# Patient Record
Sex: Female | Born: 1995 | Race: Black or African American | Hispanic: No | Marital: Single | Smoking: Never smoker
Health system: Southern US, Community
[De-identification: ages and names within clinical notes are randomized; demographics above are authoritative.]

## PROBLEM LIST (undated history)

## (undated) DIAGNOSIS — D649 Anemia, unspecified: Secondary | ICD-10-CM

## (undated) DIAGNOSIS — A549 Gonococcal infection, unspecified: Secondary | ICD-10-CM

## (undated) DIAGNOSIS — F32A Depression, unspecified: Secondary | ICD-10-CM

## (undated) DIAGNOSIS — F329 Major depressive disorder, single episode, unspecified: Secondary | ICD-10-CM

## (undated) DIAGNOSIS — A749 Chlamydial infection, unspecified: Secondary | ICD-10-CM

## (undated) HISTORY — PX: NO PAST SURGERIES: SHX2092

## (undated) HISTORY — DX: Depression, unspecified: F32.A

## (undated) HISTORY — DX: Chlamydial infection, unspecified: A74.9

---
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
## 2013-02-08 ENCOUNTER — Encounter: Payer: Self-pay | Admitting: Family Medicine

## 2013-02-08 ENCOUNTER — Ambulatory Visit (INDEPENDENT_AMBULATORY_CARE_PROVIDER_SITE_OTHER): Payer: Medicaid Other | Admitting: Family Medicine

## 2013-02-08 ENCOUNTER — Other Ambulatory Visit (HOSPITAL_COMMUNITY)
Admission: RE | Admit: 2013-02-08 | Discharge: 2013-02-08 | Disposition: A | Discharge status: Home / Self Care | Payer: Medicaid Other | Source: Ambulatory Visit | Attending: Family Medicine | Admitting: Family Medicine

## 2013-02-08 VITALS — BP 113/72 | HR 87 | Temp 98.2°F | Ht 67.0 in | Wt 136.1 lb

## 2013-02-08 DIAGNOSIS — Z202 Contact with and (suspected) exposure to infections with a predominantly sexual mode of transmission: Secondary | ICD-10-CM | POA: Insufficient documentation

## 2013-02-08 DIAGNOSIS — N898 Other specified noninflammatory disorders of vagina: Secondary | ICD-10-CM

## 2013-02-08 DIAGNOSIS — N760 Acute vaginitis: Secondary | ICD-10-CM

## 2013-02-08 DIAGNOSIS — B9689 Other specified bacterial agents as the cause of diseases classified elsewhere: Secondary | ICD-10-CM | POA: Insufficient documentation

## 2013-02-08 DIAGNOSIS — F32A Depression, unspecified: Secondary | ICD-10-CM

## 2013-02-08 DIAGNOSIS — A5901 Trichomonal vulvovaginitis: Secondary | ICD-10-CM

## 2013-02-08 DIAGNOSIS — F329 Major depressive disorder, single episode, unspecified: Secondary | ICD-10-CM

## 2013-02-08 DIAGNOSIS — Z113 Encounter for screening for infections with a predominantly sexual mode of transmission: Secondary | ICD-10-CM | POA: Insufficient documentation

## 2013-02-08 DIAGNOSIS — Z975 Presence of (intrauterine) contraceptive device: Secondary | ICD-10-CM

## 2013-02-08 DIAGNOSIS — Z0000 Encounter for general adult medical examination without abnormal findings: Secondary | ICD-10-CM

## 2013-02-08 DIAGNOSIS — A499 Bacterial infection, unspecified: Secondary | ICD-10-CM

## 2013-02-08 LAB — POCT WET PREP (WET MOUNT): Clue Cells Wet Prep Whiff POC: POSITIVE

## 2013-02-08 MED ORDER — METRONIDAZOLE 500 MG PO TABS: 500.0000 mg | ORAL_TABLET | Freq: Three times a day (TID) | ORAL | Status: DC

## 2013-02-08 NOTE — Assessment & Plan Note (Signed)
Grossly positive on wet prep, with marked cervical friability and vaginal discharge consistent with diagnosis. Rx for Flagyl 500 mg TID for 7 days. Recommended discussing diagnosis with any partners, though pt states she has not been sexually active in "months." F/u as needed.

## 2013-02-08 NOTE — Assessment & Plan Note (Signed)
Last intercourse was "months ago," though pt positive for Trichomonas this visit. See separate problem list notes. GC / Chlamydia test collected today. Reviewed safe sex practices to prevent STI's. IUD in place for birth control. Will call pt with results of testing today and follow-up as needed.

## 2013-02-08 NOTE — Assessment & Plan Note (Signed)
Main focus of today's visit, though several separate issues identified. Pt not yet 19, so Pap smear not sent (initially collected but not sent for path after review of current guidelines). Pt refuses flu shot. F/u in 1 year for regular well check; f/u sooner for acute depression issue --> see separate problem list note.

## 2013-02-08 NOTE — Assessment & Plan Note (Signed)
Markedly positive wet prep, also with Trichomonas. Rx for Flagyl 500 mg TID for 7 days. F/u as needed.

## 2013-02-08 NOTE — Progress Notes (Signed)
   Subjective:    Patient ID: Sylvia Green, female    DOB: February 25, 1995, 18 y.o.   MRN: [id number]  HPI: Pt presents to clinic for general follow-up / yearly exam. She generally feels well but does report some symptoms of depression. PHQ-9 results / psych ROS below. Otherwise she feels well but requests STI screening.  Possible exposure to STI: - Last activity was "months ago," exboyfriend, not sexually active now - pt reports discharge: foul smell, brownish in color, present on and off for "weeks" - pt does have Mirena in place and states she has never been able to feel strings  Psych concerns: - pt reports feeling down / sad for several weeks, less interested in things than usual - see PHQ-9 flowsheet; score 19, rated "very difficult," with question #9 (suicidality) marked "nearly every day" - pt DENIES current suicidality; last thought of self-harm was "about a week ago" - denies plan / reliable access to weapons - reports she has never been evaluated or treated for depression / mood disorder before  Review of Systems: No fevers, chills, abdominal pain, chest pain, SOB. No N/V/D. No sick contacts.     Objective:   Physical Exam BP 113/72  Pulse 87  Temp(Src) 98.2 F (36.8 C) (Oral)  Ht 5\' 7"  (1.702 m)  Wt 136 lb 1.6 oz (61.735 kg)  BMI 21.31 kg/m2 Gen: well-appearing but slightly withdrawn young adult female in NAD HEENT: [place]/AT, sclerae/conjunctivae clear, no lid lag, EOMI, PERRLA   MMM, posterior oropharynx clear, no cervical lymphadenopathy  neck supple with full ROM, no masses appreciated; thyroid not enlarged  Cardio: RRR, no murmur appreciated; distal pulses intact/symmetric Pulm: CTAB, no wheezes, normal WOB  Abd: soft, nondistended, BS+, no HSM GU: no external vulvar lesions  Speculum exam: IUD strings easily visible at cervical os   Cervix markedly friable with swab for wet prep / GC+Chlamydia   Moderate amount of mucous-y gray discharge  Bimanual exam: no fundal  tenderness or adnexal masses, no CMT tenderness Ext: warm/well-perfused, no cyanosis/clubbing/edema MSK: strength 5/5 in all four extremities, no frank joint deformity/effusion  normal ROM to all four extremities with no point muscle/bony tenderness in spine Neuro/Psych: alert/oriented, sensation grossly intact; normal gait/balance  mood mildly depressed with congruent blunted affect  DENIES SI / HI or AH / VH, thought content normal, process linear / goal-directed  Not acutely psychotic, hallucinating, or delusional    Assessment & Plan:

## 2013-02-08 NOTE — Patient Instructions (Addendum)
Thank you for coming in, today!  You have two infections, trichomonas and bacterial vaginosis. Trichomonas is a sexually-transmitted infection. BV is an overgrowth of normal vaginal bacteria. They are both treated with a medicine called metronidazole. You should take the medicine three times a day for 7 days. DO NOT drink alcohol while taking this medicine. I will call you with the results of the other tests we did.  For your mood: I'm worried that you may need to be treated for depression. Come back to see me in about 2 weeks specifically to talk about this. In the meantime, if you feel like you want to harm yourself, call 911 or [facility] immediately [facility]. The EMT's can help you and [facility] has psychiatrists there that can evaluate you right away.  Please feel free to call with any questions or concerns at any time, at [phone]. --Dr. Casper HarrisonStreet

## 2013-02-08 NOTE — Assessment & Plan Note (Signed)
Major concern of today's findings. PHQ-9 markedly positive, with question 9 positive, though pt repeatedly denies current suicidal ideation, reliable access to weapons, or specific plan or intent for self-harm. She denies history of previous evaluation or diagnosis / treatment of depression, and is interested in further counseling / work-up. Discussed at length the importance of following up for a visit specifically to address this. Reviewed instructions to call 911 or to [facility] if pt feels acutely suicidal for immediate evaluation / assistance. Will follow up in 1-2 weeks and will discuss with Dr. Pascal LuxKane.

## 2013-02-13 ENCOUNTER — Encounter: Payer: Self-pay | Admitting: Family Medicine

## 2013-02-22 ENCOUNTER — Encounter: Payer: Self-pay | Admitting: Family Medicine

## 2013-02-22 ENCOUNTER — Ambulatory Visit (INDEPENDENT_AMBULATORY_CARE_PROVIDER_SITE_OTHER): Payer: Medicaid Other | Admitting: Family Medicine

## 2013-02-22 VITALS — BP 120/74 | HR 104 | Temp 101.4°F | Ht 67.0 in | Wt 137.6 lb

## 2013-02-22 DIAGNOSIS — J111 Influenza due to unidentified influenza virus with other respiratory manifestations: Secondary | ICD-10-CM | POA: Insufficient documentation

## 2013-02-22 DIAGNOSIS — F32A Depression, unspecified: Secondary | ICD-10-CM

## 2013-02-22 DIAGNOSIS — F329 Major depressive disorder, single episode, unspecified: Secondary | ICD-10-CM

## 2013-02-22 MED ORDER — OSELTAMIVIR PHOSPHATE 75 MG PO CAPS: 75.0000 mg | ORAL_CAPSULE | Freq: Two times a day (BID) | ORAL | Status: DC

## 2013-02-22 MED ORDER — FLUOXETINE HCL 20 MG PO TABS: 20.0000 mg | ORAL_TABLET | Freq: Every day | ORAL | Status: DC

## 2013-02-22 MED ORDER — ONDANSETRON HCL 4 MG PO TABS: 4.0000 mg | ORAL_TABLET | Freq: Three times a day (TID) | ORAL | Status: DC | PRN

## 2013-02-22 NOTE — Assessment & Plan Note (Signed)
Concerning for primary major depression, though difficult to ascertain how long current symptoms have been present. PHQ-9 completed last visit, and pt continues to voice suicidality in the PAST but NONE currently, without reliable access to weapons and no specific plan or intent for self-harm. Rx to start Prozac 20 mg; pt presented with likely flu, so may delay starting for several days until feeling better from that illness. Provided pt with contact information for Dr. Pascal LuxKane and explained to pt that medication in conjunction with therapy is more likely to help that either medication or therapy alone. Reviewed red flags for worsening symptoms and expected side effects of medications. Reviewed instructions for when to call emergency line for help or when to call 911 / [facility]. Follow-up in about 2-4 weeks to re-eval once starting medication.

## 2013-02-22 NOTE — Progress Notes (Signed)
   Subjective:    Patient ID: Sylvia Green, female    DOB: Jul 18, 1995, 18 y.o.   MRN: [id number]  HPI: Pt presents to clinic for f/u of depression (see my last progress note, PHQ-9 markedly positive, with question 9 scored with a 3). Of note, pt also complains of fever / chills, body aches for 1 day, with temp of 101+.  #Fever / body aches: - fever, body aches, sneezing, headache, since this morning - drinking water but appetite is pretty poor - denies N/V/D, SOB, chest pain - has taken no medications, denies any alleviating or exacerbating factors - did NOT get flu shot this year, no definite sick contacts  # Depression: Presenting Issue: markedly positive depression screen on 1/22, with intermittent suicidal ideation. - pt reports incident of drawing "stars" on a piece of paper last week  - she states she "wasn't paying attention and spaced out" and noticed the stars spelled out "DIE"  - pt has never had this happen before - pt reports she first felt like harming herself when she was 14 - last time she felt this way was about 1 month ago  Report of symptoms: Sleep - decreased Interest - decreased  Guilt/worthlessness - present Energy - decreased or fatigued, present  Concentration/difficulty making decisions - present Appetite - normal Psychomotor activity - not present Suicidal ideation - not CURRENTLY present  Duration of symptoms: "years," "since I was a teenager," first around age 18 Impact on function: moderate - pt does not engage in hobbies like she used to and takes longer to do daily activities  Psychiatric History (include age of onset of first mood disturbance): - Diagnoses: never prior - Treatment: non prior  Family history of psychiatric issues: none Current and history of substance use: none  Medical conditions that might explain or contribute to symptoms: none  PHQ-9: Questions 1, 3, 4, 5, 7 scored 2; questions 2 and 9 scored 3; overall "very  difficult")  -these answers provided at previous visit  -pt repeatedly stated at that time she did NOT feel actively suicidal  MDQ (if indicated): negative  Review of Systems: As above. Again strongly denies feelings of active suicidality, today.     Objective:   Physical Exam BP 120/74  Pulse 104  Temp(Src) 101.4 F (38.6 C) (Oral)  Ht 5\' 7"  (1.702 m)  Wt 137 lb 9.6 oz (62.415 kg)  BMI 21.55 kg/m2  Breastfeeding? Yes Gen: uncomfortable but non-toxic-appearing adult female in NAD Psych: mood depressed with congruent, flattened affect  Generally very poor eye-contact but thought content normal with linear / goal-directed process  NOT actively suicidal, without AH / VH or active psychosis or delusion  Judgment / insight seem fair Cardio: slight tachycardia, but no murmur appreciated, limbs well-perfused Pulm: CTAB, no wheezes, normal WOB HEENT: mildly red posterior oropharynx, TM's clear, MM slightly dry  PERRLA, EOMI, nasal mucosae red / inflamed / mildly edematous  Shotty cervical lymphadenopathy but nontender Skin: warm to touch, not diaphoretic / dry, no rashes     Assessment & Plan:  See problem list notes.

## 2013-02-22 NOTE — Patient Instructions (Signed)
Thank you for coming in, today!  I think you may have the flu. I want you to take Tamiflu twice a day for 5 days. Your symptoms should be better in 7-10 days. If they're not better or if they get worse, come back see me.  I do think htat you are depressed. I think that medicine may help. I want you to start taking Prozac (fluoxetine) 20 mg once a day, every day. It may take 2-4 weeks or longer for it to start working. If you take it and your feelings get worse, or if you feel like you want to harm yourself, call 911 or [facility].  I want you to come back to see me in about 2 weeks. Please feel free to call with any questions or concerns at any time, at [phone]. --Dr. Casper HarrisonStreet

## 2013-02-22 NOTE — Assessment & Plan Note (Signed)
Symptoms present for 24 hours or less, overall consistent with influenza (fever, body aches, coryza, etc, in non-immunized otherwise healthy patient). Rx for Tamiflu and push fluids. Red flags reviewed. F/u as needed.

## 2013-03-08 ENCOUNTER — Ambulatory Visit: Payer: Medicaid Other | Admitting: Family Medicine

## 2013-04-04 ENCOUNTER — Encounter: Payer: Self-pay | Admitting: Family Medicine

## 2013-04-04 ENCOUNTER — Ambulatory Visit (INDEPENDENT_AMBULATORY_CARE_PROVIDER_SITE_OTHER): Payer: Medicaid Other | Admitting: Family Medicine

## 2013-04-04 VITALS — BP 115/77 | HR 90 | Temp 97.7°F | Ht 68.0 in | Wt 133.8 lb

## 2013-04-04 DIAGNOSIS — F32A Depression, unspecified: Secondary | ICD-10-CM

## 2013-04-04 DIAGNOSIS — F329 Major depressive disorder, single episode, unspecified: Secondary | ICD-10-CM

## 2013-04-04 NOTE — Assessment & Plan Note (Signed)
A: Improved with Prozac, but still with some symptoms, most notably poor interest and feeling "down." Denies frank side effects of medication. No SI / HI. Potentially interested in therapy but has not yet contacted Dr. Pascal LuxKane or any other counselors / therapists.  P: Continue Prozac 20 mg for now. Provided pt with Dr. Carola RhineKane's number again and encouraged her to talk to Dr. Pascal LuxKane about possible therapy and explained that calling does not obligate her (the pt) to any therapy if she does not decide to do any. F/u with me in 3 weeks, then likely every 2-3 months or sooner if needed.

## 2013-04-04 NOTE — Progress Notes (Signed)
   Subjective:    Patient ID: Sylvia Green, female    DOB: 19-Dec-1995, 18 y.o.   MRN: [id number]  HPI: Pt presents to clinic for f/u on depression. She started Prozac 20 mg daily about 3 weeks ago, which has been helping. Overall, she feels less "down," most days but still does have days with little interest in activities. Her sleep has been "some" improved. Overall, she is slightly vague about her symptoms. She denies SI / HI. She has had no GI side effects with the Prozac. Of note, pt had the flu around the time of starting Prozac. She is feeling much better and completed a course of Tamiflu. She has had no further coryza-type symptoms in the past several weeks.  Review of Systems: As above. Denies fever / chills, SOB, abdominal pain, N / V.     Objective:   Physical Exam BP 115/77  Pulse 90  Temp(Src) 97.7 F (36.5 C) (Oral)  Ht 5\' 8"  (1.727 m)  Wt 133 lb 12.8 oz (60.691 kg)  BMI 20.35 kg/m2 Gen: well-appearing young adult female in NAD Pulm: CTAB, no wheezes Cardio: RRR, no murmur Abd: soft, nontender, BS+  Mental Status Examination/Evaluation: Objective:  Appearance: Neat and Well Groomed  Psychomotor Activity:  Normal  Eye Contact::  Fair  Speech:  Clear and Coherent and Normal Rate  Volume:  Normal  Mood:  Relatively euthymic  Affect:  Appropriate and Congruent  Thought Process:  Coherent, Goal Directed and Linear  Orientation:  Full (Time, Place, and Person)  Thought Content:  Normal  Suicidal Thoughts:  No  Homicidal Thoughts:  No  Judgement:  Fair  Insight:  Fair      Assessment & Plan:

## 2013-04-04 NOTE — Patient Instructions (Signed)
Thank you for coming in, today!  Keep taking your Prozac 20 mg daily. I would expect it to keep doing better for the next 3 weeks. It takes about that long to get the maximum effect (six weeks from starting).  Come back to see me in about 3 weeks. At that time, we'll see if we need to adjust or increase anything. If your symptoms get worse or new symptoms come up in the meantime, you can always come back sooner.  I will give you Dr. Carola RhineKane's card. Call her to talk about maybe getting set up for counseling. You can call her to talk about it without being obligated to starting anything. She can give you good resources on things that may help the medicine work even better.  Please feel free to call with any questions or concerns at any time, at [phone]. --Dr. Casper HarrisonStreet

## 2013-04-27 ENCOUNTER — Ambulatory Visit: Payer: Medicaid Other | Admitting: Family Medicine

## 2013-05-31 ENCOUNTER — Ambulatory Visit: Payer: Medicaid Other | Admitting: Family Medicine

## 2013-08-18 ENCOUNTER — Encounter (HOSPITAL_COMMUNITY): Payer: Self-pay | Admitting: Emergency Medicine

## 2013-08-18 ENCOUNTER — Emergency Department (HOSPITAL_COMMUNITY)
Admission: EM | Admit: 2013-08-18 | Discharge: 2013-08-18 | Disposition: A | Discharge status: Home / Self Care | Payer: Medicaid Other | Attending: Emergency Medicine | Admitting: Emergency Medicine

## 2013-08-18 DIAGNOSIS — Z79899 Other long term (current) drug therapy: Secondary | ICD-10-CM | POA: Insufficient documentation

## 2013-08-18 DIAGNOSIS — Z3202 Encounter for pregnancy test, result negative: Secondary | ICD-10-CM | POA: Insufficient documentation

## 2013-08-18 DIAGNOSIS — Z8619 Personal history of other infectious and parasitic diseases: Secondary | ICD-10-CM | POA: Insufficient documentation

## 2013-08-18 DIAGNOSIS — R55 Syncope and collapse: Secondary | ICD-10-CM | POA: Insufficient documentation

## 2013-08-18 DIAGNOSIS — R404 Transient alteration of awareness: Secondary | ICD-10-CM | POA: Insufficient documentation

## 2013-08-18 DIAGNOSIS — R42 Dizziness and giddiness: Secondary | ICD-10-CM | POA: Insufficient documentation

## 2013-08-18 DIAGNOSIS — I951 Orthostatic hypotension: Secondary | ICD-10-CM

## 2013-08-18 LAB — BASIC METABOLIC PANEL
ANION GAP: 13 (ref 5–15)
BUN: 8 mg/dL (ref 6–23)
CHLORIDE: 104 meq/L (ref 96–112)
CO2: 22 meq/L (ref 19–32)
CREATININE: 0.76 mg/dL (ref 0.50–1.10)
Calcium: 9.3 mg/dL (ref 8.4–10.5)
GFR calc Af Amer: >90 mL/min (ref >90)
GFR calc non Af Amer: >90 mL/min (ref >90)
GLUCOSE: 84 mg/dL (ref 70–99)
Potassium: 3.8 mEq/L (ref 3.7–5.3)
Sodium: 139 mEq/L (ref 137–147)

## 2013-08-18 LAB — CBC WITH DIFFERENTIAL/PLATELET
BASOS ABS: 0 10*3/uL (ref 0.0–0.1)
BASOS PCT: 1 % (ref 0–1)
EOS ABS: 0.1 10*3/uL (ref 0.0–0.7)
Eosinophils Relative: 1 % (ref 0–5)
HCT: 34.6 % — ABNORMAL LOW (ref 36.0–46.0)
HEMOGLOBIN: 11.8 g/dL — AB (ref 12.0–15.0)
Lymphocytes Relative: 32 % (ref 12–46)
Lymphs Abs: 2.4 10*3/uL (ref 0.7–4.0)
MCH: 31.2 pg (ref 26.0–34.0)
MCHC: 34.1 g/dL (ref 30.0–36.0)
MCV: 91.5 fL (ref 78.0–100.0)
MONO ABS: 0.6 10*3/uL (ref 0.1–1.0)
MONOS PCT: 7 % (ref 3–12)
NEUTROS ABS: 4.5 10*3/uL (ref 1.7–7.7)
NEUTROS PCT: 59 % (ref 43–77)
Platelets: 166 10*3/uL (ref 150–400)
RBC: 3.78 MIL/uL — AB (ref 3.87–5.11)
RDW: 13.2 % (ref 11.5–15.5)
WBC: 7.6 10*3/uL (ref 4.0–10.5)

## 2013-08-18 LAB — URINALYSIS, ROUTINE W REFLEX MICROSCOPIC
BILIRUBIN URINE: NEGATIVE
GLUCOSE, UA: NEGATIVE mg/dL
Ketones, ur: NEGATIVE mg/dL
Leukocytes, UA: NEGATIVE
NITRITE: NEGATIVE
PH: 8 (ref 5.0–8.0)
Protein, ur: NEGATIVE mg/dL
SPECIFIC GRAVITY, URINE: 1.014 (ref 1.005–1.030)
Urobilinogen, UA: 0.2 mg/dL (ref 0.0–1.0)

## 2013-08-18 LAB — POC URINE PREG, ED: Preg Test, Ur: NEGATIVE

## 2013-08-18 LAB — URINE MICROSCOPIC-ADD ON

## 2013-08-18 MED ORDER — SODIUM CHLORIDE 0.9 % IV BOLUS (SEPSIS)
1000.0000 mL | Freq: Once | INTRAVENOUS | Status: AC
  Administered 2013-08-18: 1000 mL via INTRAVENOUS

## 2013-08-18 NOTE — ED Notes (Signed)
CBC requires recollect, per lab, blood was hemolyzed.  Phlebotomy notified

## 2013-08-18 NOTE — Discharge Instructions (Signed)
Syncope °Syncope is a medical term for fainting or passing out. This means you lose consciousness and drop to the ground. People are generally unconscious for less than 5 minutes. You may have some muscle twitches for up to 15 seconds before waking up and returning to normal. Syncope occurs more often in older adults, but it can happen to anyone. While most causes of syncope are not dangerous, syncope can be a sign of a serious medical problem. It is important to seek medical care.  °CAUSES  °Syncope is caused by a sudden drop in blood flow to the brain. The specific cause is often not determined. Factors that can bring on syncope include: °· Taking medicines that lower blood pressure. °· Sudden changes in posture, such as standing up quickly. °· Taking more medicine than prescribed. °· Standing in one place for too long. °· Seizure disorders. °· Dehydration and excessive exposure to heat. °· Low blood sugar (hypoglycemia). °· Straining to have a bowel movement. °· Heart disease, irregular heartbeat, or other circulatory problems. °· Fear, emotional distress, seeing blood, or severe pain. °SYMPTOMS  °Right before fainting, you may: °· Feel dizzy or light-headed. °· Feel nauseous. °· See all white or all black in your field of vision. °· Have cold, clammy skin. °DIAGNOSIS  °Your health care provider will ask about your symptoms, perform a physical exam, and perform an electrocardiogram (ECG) to record the electrical activity of your heart. Your health care provider may also perform other heart or blood tests to determine the cause of your syncope which may include: °· Transthoracic echocardiogram (TTE). During echocardiography, sound waves are used to evaluate how blood flows through your heart. °· Transesophageal echocardiogram (TEE). °· Cardiac monitoring. This allows your health care provider to monitor your heart rate and rhythm in real time. °· Holter monitor. This is a portable device that records your  heartbeat and can help diagnose heart arrhythmias. It allows your health care provider to track your heart activity for several days, if needed. °· Stress tests by exercise or by giving medicine that makes the heart beat faster. °TREATMENT  °In most cases, no treatment is needed. Depending on the cause of your syncope, your health care provider may recommend changing or stopping some of your medicines. °HOME CARE INSTRUCTIONS °· Have someone stay with you until you feel stable. °· Do not drive, use machinery, or play sports until your health care provider says it is okay. °· Keep all follow-up appointments as directed by your health care provider. °· Lie down right away if you start feeling like you might faint. Breathe deeply and steadily. Wait until all the symptoms have passed. °· Drink enough fluids to keep your urine clear or pale yellow. °· If you are taking blood pressure or heart medicine, get up slowly and take several minutes to sit and then stand. This can reduce dizziness. °SEEK IMMEDIATE MEDICAL CARE IF:  °· You have a severe headache. °· You have unusual pain in the chest, abdomen, or back. °· You are bleeding from your mouth or rectum, or you have black or tarry stool. °· You have an irregular or very fast heartbeat. °· You have pain with breathing. °· You have repeated fainting or seizure-like jerking during an episode. °· You faint when sitting or lying down. °· You have confusion. °· You have trouble walking. °· You have severe weakness. °· You have vision problems. °If you fainted, call your local emergency services (911 in [place] Do not drive   yourself to the hospital.  °MAKE SURE YOU: °· Understand these instructions. °· Will watch your condition. °· Will get help right away if you are not doing well or get worse. °Document Released: 01/04/2005 Document Revised: 01/09/2013 Document Reviewed: 03/05/2011 °[employer]® Patient Information ©2015 [employer]. This information is not intended to replace  advice given to you by your health care provider. Make sure you discuss any questions you have with your health care provider. ° °

## 2013-08-18 NOTE — ED Notes (Signed)
Bed: WA08 Expected date:  Expected time:  Means of arrival:  Comments: EMS syncope/ok now

## 2013-08-18 NOTE — ED Notes (Addendum)
Repeat CBC collected and sent to lab at this time.

## 2013-08-18 NOTE — ED Provider Notes (Signed)
CSN: [id number]     Arrival date & time 08/18/13  1733 History   First MD Initiated Contact with Patient 08/18/13 1744     Chief Complaint  Patient presents with  . Loss of Consciousness     (Consider location/radiation/quality/duration/timing/severity/associated sxs/prior Treatment) Patient is a 18 y.o. female presenting with syncope.  Loss of Consciousness Episode history:  Single Most recent episode:  Today Duration:  1 minute Timing:  Constant Progression:  Resolved Chronicity:  Recurrent (similar episode one year ago) Context: standing up   Witnessed: yes   Relieved by:  Lying down Associated symptoms: dizziness   Associated symptoms: no chest pain and no nausea     Past Medical History  Diagnosis Date  . Gonorrhea    Past Surgical History  Procedure Laterality Date  . No past surgeries     Family History  Problem Relation Age of Onset  . Hypertension Mother   . Depression Mother   . Anxiety disorder Mother   . Hyperlipidemia Mother    History  Substance Use Topics  . Smoking status: Never Smoker   . Smokeless tobacco: Never Used  . Alcohol Use: No   OB History   Grav Para Term Preterm Abortions TAB SAB Ect Mult Living   2 2 2       2      Review of Systems  Cardiovascular: Positive for syncope. Negative for chest pain.  Gastrointestinal: Negative for nausea.  Neurological: Positive for dizziness.  All other systems reviewed and are negative.     Allergies  Review of patient's allergies indicates no known allergies.  Home Medications   Prior to Admission medications   Medication Sig Start Date End Date Taking? Authorizing Provider  clotrimazole-betamethasone (LOTRISONE) cream Apply to affected area 2 times daily 10/15/12   Graylon Good, PA-C  FLUoxetine (PROZAC) 20 MG tablet Take 1 tablet (20 mg total) by mouth daily. 02/22/13   Stephanie Coup Street, MD  levonorgestrel (MIRENA) 20 MCG/24HR IUD 1 each by Intrauterine route once. Pt had it placed  on 10/07/11    Historical Provider, MD  ondansetron (ZOFRAN) 4 MG tablet Take 1 tablet (4 mg total) by mouth every 8 (eight) hours as needed for nausea or vomiting. 02/22/13   Stephanie Coup Street, MD   BP 106/58  Pulse 76  Temp(Src) 99 F (37.2 C) (Oral)  Resp 18  SpO2 100% Physical Exam  Nursing note and vitals reviewed. Constitutional: She is oriented to person, place, and time. She appears well-developed and well-nourished. No distress.  HENT:  Head: Normocephalic and atraumatic.  Mouth/Throat: Oropharynx is clear and moist.  Eyes: Conjunctivae are normal. Pupils are equal, round, and reactive to light. No scleral icterus.  Neck: Neck supple.  Cardiovascular: Normal rate, regular rhythm, normal heart sounds and intact distal pulses.   No murmur heard. Pulmonary/Chest: Effort normal and breath sounds normal. No stridor. No respiratory distress. She has no rales.  Abdominal: Soft. Bowel sounds are normal. She exhibits no distension. There is no tenderness.  Musculoskeletal: Normal range of motion.  Neurological: She is alert and oriented to person, place, and time.  Skin: Skin is warm and dry. No rash noted.  Psychiatric: She has a normal mood and affect. Her behavior is normal.    ED Course  Procedures (including critical care time) Labs Review Labs Reviewed  URINALYSIS, ROUTINE W REFLEX MICROSCOPIC - Abnormal; Notable for the following:    Hgb urine dipstick SMALL (*)    All other  components within normal limits  CBC WITH DIFFERENTIAL - Abnormal; Notable for the following:    RBC 3.78 (*)    Hemoglobin 11.8 (*)    HCT 34.6 (*)    All other components within normal limits  BASIC METABOLIC PANEL  URINE MICROSCOPIC-ADD ON  CBC WITH DIFFERENTIAL  POC URINE PREG, ED    Imaging Review No results found.   EKG Interpretation   Date/Time:  Saturday August 18 2013 17:44:06 EDT Ventricular Rate:  73 PR Interval:  144 QRS Duration: 88 QT Interval:  391 QTC Calculation:  431 R Axis:   68 Text Interpretation:  Sinus rhythm No significant change was found  Confirmed by [facility]  MD, TREY ([id number]) on 08/18/2013 6:04:59 PM      MDM   Final diagnoses:  Orthostatic syncope    18 yo female presenting with syncope.  Likely orthostatic after standing up.  Orthostatics positive.  Well appearing now.  Similar episode a year ago.  She reports no fam hx of sudden cardiac death.  EKG negative, no delta waves, brugade, or prolonged QT.  Plan to check basic labs, give IV fluids.  Anticipate dc.    Labs negative.  Pt feels better.  Appears stable for dc home with pcp follow up.    Candyce ChurnJohn David Lacorey Brusca III, MD 08/19/13 0030

## 2013-08-18 NOTE — ED Notes (Signed)
Mom found patient lying in floor today.  Family reports patient unconscious for maybe a minute.  CBG-92.  Patient alert and oriented ambulatory at scene.  No bruising, EMS reported patient did not hit head.  No c/o pain.  VSS.

## 2013-08-27 ENCOUNTER — Ambulatory Visit: Payer: Medicaid Other | Admitting: Family Medicine

## 2013-09-07 ENCOUNTER — Ambulatory Visit: Payer: Medicaid Other | Admitting: Family Medicine

## 2013-10-03 ENCOUNTER — Ambulatory Visit (INDEPENDENT_AMBULATORY_CARE_PROVIDER_SITE_OTHER): Payer: Medicaid Other | Admitting: Family Medicine

## 2013-10-03 ENCOUNTER — Encounter: Payer: Self-pay | Admitting: Family Medicine

## 2013-10-03 VITALS — BP 105/69 | HR 93 | Temp 98.4°F | Ht 68.0 in | Wt 130.6 lb

## 2013-10-03 DIAGNOSIS — F329 Major depressive disorder, single episode, unspecified: Secondary | ICD-10-CM

## 2013-10-03 DIAGNOSIS — D649 Anemia, unspecified: Secondary | ICD-10-CM

## 2013-10-03 DIAGNOSIS — F32A Depression, unspecified: Secondary | ICD-10-CM

## 2013-10-03 LAB — CBC
HEMATOCRIT: 35.1 % — AB (ref 36.0–46.0)
HEMOGLOBIN: 12.5 g/dL (ref 12.0–15.0)
MCH: 31.4 pg (ref 26.0–34.0)
MCHC: 35.6 g/dL (ref 30.0–36.0)
MCV: 88.2 fL (ref 78.0–100.0)
Platelets: 214 10*3/uL (ref 150–400)
RBC: 3.98 MIL/uL (ref 3.87–5.11)
RDW: 13.7 % (ref 11.5–15.5)
WBC: 6.4 10*3/uL (ref 4.0–10.5)

## 2013-10-03 MED ORDER — FLUOXETINE HCL 20 MG PO TABS: 20.0000 mg | ORAL_TABLET | Freq: Every day | ORAL | Status: DC

## 2013-10-03 MED ORDER — FERROUS SULFATE 325 (65 FE) MG PO TABS: 325.0000 mg | ORAL_TABLET | Freq: Every day | ORAL | Status: DC

## 2013-10-03 NOTE — Progress Notes (Signed)
   Subjective:    Patient ID: Sylvia Green, female    DOB: 1995-11-23, 18 y.o.   MRN: [id number]  HPI: Pt presents to clinic with mother with several complaints, including depression and thoughts of self-harm (cutting her arm); she has thought about this but has not done anything. She does not have specific hobbies and is slightly vague about whether or not she has true anhedonia. She does endorse insomnia, and feels "run down" and tired often. She also complains of "feeling cold all the time," and had episode of syncope about a month ago that lasted a few minutes. She was seen in the ED at that time and felt better with IV fluids; she states she feels like she drinks a good amount of fluids.  Review of Systems: As above. Denies fever / chills, N/V. Does have some occasional abdominal cramping; she has a Mirena in place and has regular periods. She denies vaginal burning, itching, or discharge.     Objective:   Physical Exam BP 105/69  Pulse 93  Temp(Src) 98.4 F (36.9 C) (Oral)  Ht  (1.727 m)  Wt 130 lb 9.6 oz (59.24 kg)  BMI 19.86 kg/m2 Gen: well-appearing but slightly withdrawn young adult female in NAD HEENT: [place]/AT, EOMI, TM's clear bilaterally  Conjunctivae and gingivae slightly pale  Oropharynx clear Cardio: RRR, no murmur appreciated Pulm: CTAB, no wheezes Skin: warm, dry, intact Ext: warm, well-perfused  Mental Status Examination/Evaluation: Objective:  Appearance: Casual and Well Groomed  Psychomotor Activity:  Decreased  Eye Contact::  Fair  Speech:  soft  Volume:  Decreased  Mood:  Reported depressed  Affect:  Blunt  Thought Process:  Coherent, Goal Directed and Logical  Orientation:  Full (Time, Place, and Person)  Thought Content:  normal without delusions, AH/VH or SI/HI  Suicidal Thoughts:  Yes.  without intent/plan; ONLY INTERMITTENT, NOT ACTIVE  Homicidal Thoughts:  No  Judgement:  Fair  Insight:  Fair and Shallow      Assessment & Plan:  See  problem list notes.

## 2013-10-03 NOTE — Patient Instructions (Signed)
Thank you for coming in, today!  For your depression: I want to restart you on Prozac, since that helped before. This should help with your mood and your sleep. If it does not, we can try going up on the dose. It takes about 4-6 weeks to tell much difference.  For your feeling cold all the time and passing out: You were slightly anemic about a month ago. I want to recheck that today. I will call you or send you a letter with the result. In the meantime, start taking an iron supplement. This is one pill once a day with food. We will recheck your blood in about a month to see if this is helping.  Come back to see me in about 1 month. If you have any issues in the meantime or if you feel like you want to harm yourself, call 911 immediately, or come back to see me. Please feel free to call with any questions or concerns at any time, at [phone]. --Dr. Casper Harrison

## 2013-10-03 NOTE — Assessment & Plan Note (Signed)
Noted with Hb 11.8 about 2 months ago, during syncopal episode felt to be related to hypovolemia at that time. Anemia possibly explains some feelings of being "cold" all the time and may explain lack of energy. May also be related to syncope, itself. No report of severe / heavy periods or frank blood loss. Will recheck CBC today and start oral iron, then plan to follow up in 1 month and repeat CBC at that time. F/u as needed, otherwise.

## 2013-10-03 NOTE — Assessment & Plan Note (Signed)
A: Intermittent symptoms, present again similar to previous presentation earlier this year. Was previously on Prozac 20 mg daily with some benefit, but pt did not follow up as instructed and thus ran out of her medication. Pt did not contact Dr. Pascal Lux as instructed, either. Symptoms today are somewhat vaguely described, though she denies frank / active suicidality and does not appear to be a danger to herself or others.  P: Restart Prozac 20 mg and f/u in about 1 month. Will continue to encourage pt to call Dr. Pascal Lux to discuss therapy. Reiterated calling for help (clinic, emergency line, or 911) if she feels like she wants to harm herself.

## 2013-10-04 ENCOUNTER — Encounter: Payer: Self-pay | Admitting: Family Medicine

## 2013-11-01 ENCOUNTER — Ambulatory Visit: Payer: Medicaid Other | Admitting: Family Medicine

## 2013-11-19 ENCOUNTER — Encounter: Payer: Self-pay | Admitting: Family Medicine

## 2013-12-06 ENCOUNTER — Emergency Department (HOSPITAL_COMMUNITY)
Admission: EM | Admit: 2013-12-06 | Discharge: 2013-12-07 | Disposition: A | Discharge status: Home / Self Care | Payer: Medicaid Other | Attending: Emergency Medicine | Admitting: Emergency Medicine

## 2013-12-06 ENCOUNTER — Encounter (HOSPITAL_COMMUNITY): Payer: Self-pay | Admitting: Emergency Medicine

## 2013-12-06 DIAGNOSIS — Z8619 Personal history of other infectious and parasitic diseases: Secondary | ICD-10-CM | POA: Insufficient documentation

## 2013-12-06 DIAGNOSIS — M549 Dorsalgia, unspecified: Secondary | ICD-10-CM | POA: Diagnosis present

## 2013-12-06 DIAGNOSIS — Z79899 Other long term (current) drug therapy: Secondary | ICD-10-CM | POA: Insufficient documentation

## 2013-12-06 DIAGNOSIS — N390 Urinary tract infection, site not specified: Secondary | ICD-10-CM | POA: Diagnosis not present

## 2013-12-06 NOTE — ED Provider Notes (Signed)
CSN: [id number]     Arrival date & time 12/06/13  2204 History   None   This chart was scribed for NP, Earley FavorGail Iyari Hagner, working with [name], MD by Marica OtterNusrat Rahman, ED Scribe. This patient was seen in room WTR9/WTR9 and the patient's care was started at 11:39 PM.  Chief Complaint  Patient presents with  . Back Pain  . Right Side Pain    The history is provided by the patient. No language interpreter was used.   PCP: Street, Cristal Deerhristopher, MD HPI Comments: Sylvia Green is a 18 y.o. female, with medical Hx noted below, who presents to the Emergency Department complaining of atraumatic, acute, intermittent, non-radiating right sided back pain with associated right sided pain onset 2 days ago. Pt describes the pain as a "sharp" pain and rates her present pain a 4 out of 10. 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 Pt reports he last menstraul cycle was in October. Pt denies dysuria, urinary frequency, or any other urinary Sx. Pt further denies n/v/, vaginal discharge,  or any other Sx.   Past Medical History  Diagnosis Date  . Gonorrhea    Past Surgical History  Procedure Laterality Date  . No past surgeries     Family History  Problem Relation Age of Onset  . Hypertension Mother   . Depression Mother   . Anxiety disorder Mother   . Hyperlipidemia Mother    History  Substance Use Topics  . Smoking status: Never Smoker   . Smokeless tobacco: Never Used  . Alcohol Use: No   OB History    Gravida Para Term Preterm AB TAB SAB Ectopic Multiple Living   2 2 2       2      Review of Systems  Constitutional: Negative for fever and chills.  Genitourinary: Negative for dysuria, urgency, frequency, decreased urine volume, vaginal discharge and difficulty urinating.  Musculoskeletal: Positive for back pain.  Psychiatric/Behavioral: Negative for confusion.  All other  systems reviewed and are negative.     Allergies  Review of patient's allergies indicates no known allergies.  Home Medications   Prior to Admission medications   Medication Sig Start Date End Date Taking? Authorizing Provider  ferrous sulfate 325 (65 FE) MG tablet Take 1 tablet (325 mg total) by mouth daily with breakfast. 10/03/13   Stephanie Couphristopher M Street, MD  FLUoxetine (PROZAC) 20 MG tablet Take 1 tablet (20 mg total) by mouth daily. 10/03/13   Stephanie Couphristopher M Street, MD  levonorgestrel (MIRENA) 20 MCG/24HR IUD 1 each by Intrauterine route once. Pt had it placed on 10/07/11    Historical Provider, MD  sulfamethoxazole-trimethoprim (SEPTRA DS) 800-160 MG per tablet Take 1 tablet by mouth every 12 (twelve) hours. 12/07/13   Arman FilterGail K Raziah Funnell, NP   Triage Vitals: BP 107/74 mmHg  Pulse 94  Temp(Src) 97.4 F (36.3 C) (Oral)  Resp 20  Ht 5\' 8"  (1.727 m)  Wt 130 lb (58.968 kg)  BMI 19.77 kg/m2  SpO2 100%  LMP 11/16/2013 (Approximate)  Breastfeeding? No Physical Exam  Constitutional: She is oriented to person, place, and time. She appears well-developed and well-nourished. No distress.  HENT:  Head: Normocephalic and atraumatic.  Eyes: Conjunctivae and EOM are normal.  Neck: Neck supple. No tracheal deviation present.  Cardiovascular: Normal rate.   Pulmonary/Chest: Effort normal. No respiratory distress.  Musculoskeletal: Normal range of motion.  Neurological: She is alert and oriented to person, place, and time.  Skin: Skin is warm and dry.  Psychiatric: She has a normal mood and affect. Her behavior is normal.  Nursing note and vitals reviewed.   ED Course  Procedures (including critical care time) DIAGNOSTIC STUDIES: Oxygen Saturation is 100% on RA, nl by my interpretation.    COORDINATION OF CARE: 11:43 PM-Discussed treatment plan which includes meds with pt at bedside and pt agreed to plan.   Labs Review Labs Reviewed  URINALYSIS, ROUTINE W REFLEX MICROSCOPIC - Abnormal;  Notable for the following:    APPearance CLOUDY (*)    Hgb urine dipstick TRACE (*)    Leukocytes, UA MODERATE (*)    All other components within normal limits  URINE MICROSCOPIC-ADD ON - Abnormal; Notable for the following:    Bacteria, UA MANY (*)    All other components within normal limits  URINE RAPID DRUG SCREEN (HOSP PERFORMED)    Imaging Review No results found.   EKG Interpretation None      MDM   Final diagnoses:  UTI (lower urinary tract infection)       I personally performed the services described in this documentation, which was scribed in my presence. The recorded information has been reviewed and is accurate.    Arman FilterGail K Mar Zettler, NP 12/07/13 [id number]  [name], MD 12/07/13 [phone]

## 2013-12-06 NOTE — ED Notes (Signed)
Patient reports right sided back pain and right side pain. Described as a "sharp" pain and described as two different pains without radiation. Denies any urinary abnormalities or dysuria. Started new job at [employer] recently but does not do any heavy lifting. No new exercise regimens noted. No pain medications taken today. Cannot rule out pregnancy-unsure. No other questions/concerns. Moving all extremities equally.

## 2013-12-06 NOTE — ED Notes (Signed)
Pt reports low back pain x 2 days, denies any urinary sxs at this time.

## 2013-12-06 NOTE — ED Notes (Signed)
Pt ambulated to the BR with steady gait.   

## 2013-12-07 LAB — RAPID URINE DRUG SCREEN, HOSP PERFORMED
Amphetamines: NOT DETECTED
Barbiturates: NOT DETECTED
Benzodiazepines: NOT DETECTED
Cocaine: NOT DETECTED
Opiates: NOT DETECTED
Tetrahydrocannabinol: NOT DETECTED

## 2013-12-07 LAB — URINALYSIS, ROUTINE W REFLEX MICROSCOPIC
BILIRUBIN URINE: NEGATIVE
Glucose, UA: NEGATIVE mg/dL
KETONES UR: NEGATIVE mg/dL
Nitrite: NEGATIVE
PH: 7 (ref 5.0–8.0)
Protein, ur: NEGATIVE mg/dL
Specific Gravity, Urine: 1.018 (ref 1.005–1.030)
Urobilinogen, UA: 0.2 mg/dL (ref 0.0–1.0)

## 2013-12-07 LAB — URINE MICROSCOPIC-ADD ON

## 2013-12-07 MED ORDER — SULFAMETHOXAZOLE-TRIMETHOPRIM 800-160 MG PO TABS
1.0000 | ORAL_TABLET | Freq: Once | ORAL | Status: AC
  Administered 2013-12-07: 1 via ORAL
  Filled 2013-12-07: qty 1

## 2013-12-07 MED ORDER — SULFAMETHOXAZOLE-TRIMETHOPRIM 800-160 MG PO TABS: 1.0000 | ORAL_TABLET | Freq: Two times a day (BID) | ORAL | Status: DC

## 2013-12-07 MED ORDER — IBUPROFEN 200 MG PO TABS
600.0000 mg | ORAL_TABLET | Freq: Once | ORAL | Status: AC
  Administered 2013-12-07: 600 mg via ORAL
  Filled 2013-12-07: qty 3

## 2013-12-07 NOTE — Discharge Instructions (Signed)
Make sure to take all the medications as scheduled

## 2013-12-28 ENCOUNTER — Ambulatory Visit: Payer: Medicaid Other | Admitting: Family Medicine

## 2014-02-25 ENCOUNTER — Emergency Department (HOSPITAL_COMMUNITY)
Admission: EM | Admit: 2014-02-25 | Discharge: 2014-02-25 | Disposition: A | Discharge status: Home / Self Care | Payer: Medicaid Other | Attending: Emergency Medicine | Admitting: Emergency Medicine

## 2014-02-25 ENCOUNTER — Encounter (HOSPITAL_COMMUNITY): Payer: Self-pay | Admitting: Emergency Medicine

## 2014-02-25 DIAGNOSIS — B349 Viral infection, unspecified: Secondary | ICD-10-CM

## 2014-02-25 DIAGNOSIS — Z8619 Personal history of other infectious and parasitic diseases: Secondary | ICD-10-CM | POA: Insufficient documentation

## 2014-02-25 DIAGNOSIS — Z79899 Other long term (current) drug therapy: Secondary | ICD-10-CM | POA: Diagnosis not present

## 2014-02-25 DIAGNOSIS — M791 Myalgia: Secondary | ICD-10-CM | POA: Diagnosis present

## 2014-02-25 MED ORDER — IBUPROFEN 800 MG PO TABS: 800.0000 mg | ORAL_TABLET | Freq: Three times a day (TID) | ORAL | Status: DC

## 2014-02-25 MED ORDER — IBUPROFEN 400 MG PO TABS
800.0000 mg | ORAL_TABLET | Freq: Once | ORAL | Status: AC
  Administered 2014-02-25: 800 mg via ORAL
  Filled 2014-02-25: qty 2

## 2014-02-25 NOTE — ED Notes (Signed)
Pt c/o body aches and right shoulder pain x 2 days

## 2014-02-25 NOTE — Discharge Instructions (Signed)
1. Medications: mucinex as needed for nasal congestion, OTC cough medications as needed for cough, usual home medications 2. Treatment: rest, drink plenty of fluids, take tylenol or ibuprofen for fever control and body aches 3. Follow Up: Please followup with your primary doctor in 3 days for discussion of your diagnoses and further evaluation after today's visit; if you do not have a primary care doctor use the resource guide provided to find one; Return to the ER for high fevers, difficulty breathing or other concerning symptoms    Viral Infections A viral infection can be caused by different types of viruses.Most viral infections are not serious and resolve on their own. However, some infections may cause severe symptoms and may lead to further complications. SYMPTOMS Viruses can frequently cause:  Minor sore throat.  Aches and pains.  Headaches.  Runny nose.  Different types of rashes.  Watery eyes.  Tiredness.  Cough.  Loss of appetite.  Gastrointestinal infections, resulting in nausea, vomiting, and diarrhea. These symptoms do not respond to antibiotics because the infection is not caused by bacteria. However, you might catch a bacterial infection following the viral infection. This is sometimes called a "superinfection." Symptoms of such a bacterial infection may include:  Worsening sore throat with pus and difficulty swallowing.  Swollen neck glands.  Chills and a high or persistent fever.  Severe headache.  Tenderness over the sinuses.  Persistent overall ill feeling (malaise), muscle aches, and tiredness (fatigue).  Persistent cough.  Yellow, green, or brown mucus production with coughing. HOME CARE INSTRUCTIONS   Only take over-the-counter or prescription medicines for pain, discomfort, diarrhea, or fever as directed by your caregiver.  Drink enough water and fluids to keep your urine clear or pale yellow. Sports drinks can provide valuable electrolytes,  sugars, and hydration.  Get plenty of rest and maintain proper nutrition. Soups and broths with crackers or rice are fine. SEEK IMMEDIATE MEDICAL CARE IF:   You have severe headaches, shortness of breath, chest pain, neck pain, or an unusual rash.  You have uncontrolled vomiting, diarrhea, or you are unable to keep down fluids.  You or your child has an oral temperature above 102 F (38.9 C), not controlled by medicine.  Your baby is older than 3 months with a rectal temperature of 102 F (38.9 C) or higher.  Your baby is 443 months old or younger with a rectal temperature of 100.4 F (38 C) or higher. MAKE SURE YOU:   Understand these instructions.  Will watch your condition.  Will get help right away if you are not doing well or get worse. Document Released: 10/14/2004 Document Revised: 03/29/2011 Document Reviewed: 05/11/2010 [facility] Patient Information 2015 [employer], [place]. This information is not intended to replace advice given to you by your health care provider. Make sure you discuss any questions you have with your health care provider.

## 2014-02-25 NOTE — ED Provider Notes (Signed)
CSN: [id number]     Arrival date & time 02/25/14  1443 History  This chart was scribed for Dierdre ForthHannah Renna Kilmer, PA, working with Mirian MoMatthew Gentry, MD by Elon SpannerGarrett Cook, ED Scribe. This patient was seen in room TR04C/TR04C and the patient's care was started at 4:21 PM.   Chief Complaint  Patient presents with  . Generalized Body Aches   The history is provided by the patient. No language interpreter was used.    HPI Comments: Sylvia Green is a 19 y.o. female who presents to the Emergency Department complaining of ceneralized body aches concentrated in the legs and right shoulder onset 10:00 pm yesterday.  She also complains of associated sore throat, subjective fever, and chills.  She reports a similar episode 2 years ago but was not seen and did not receive a diagnosis.  Patient reports she used Theraflu last night and ibuprofen this morning for her headache which afforded relief.  Patient denies sick contacts.  Patient denies history of medical conditions or taking medications on a regular basis.   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  Patient denies fall, shoulder injury.  Patient denies rhinorrhea, nasal congestion, cough, SOB, abdominal pain, n/v/d. NKA.       Past Medical History  Diagnosis Date  . Gonorrhea    Past Surgical History  Procedure Laterality Date  . No past surgeries     Family History  Problem Relation Age of Onset  . Hypertension Mother   . Depression Mother   . Anxiety disorder Mother   . Hyperlipidemia Mother    History  Substance Use Topics  . Smoking status: Never Smoker   . Smokeless tobacco: Never Used  . Alcohol Use: No   OB History    Gravida Para Term Preterm AB TAB SAB Ectopic Multiple Living   2 2 2       2      Review of Systems  Constitutional: Positive for fever, chills and fatigue. Negative for appetite change.  HENT: Positive for sore throat. Negative for congestion, ear discharge, ear pain, mouth sores, postnasal drip, rhinorrhea and  sinus pressure.   Eyes: Negative for visual disturbance.  Respiratory: Negative for cough, chest tightness, shortness of breath, wheezing and stridor.   Cardiovascular: Negative for chest pain, palpitations and leg swelling.  Gastrointestinal: Negative for nausea, vomiting, abdominal pain and diarrhea.  Genitourinary: Negative for dysuria, urgency, frequency and hematuria.  Musculoskeletal: Positive for myalgias. Negative for back pain, arthralgias and neck stiffness.  Skin: Negative for rash.  Neurological: Positive for headaches. Negative for syncope, light-headedness and numbness.  Hematological: Negative for adenopathy.  Psychiatric/Behavioral: The patient is not nervous/anxious.   All other systems reviewed and are negative.     Allergies  Review of patient's allergies indicates no known allergies.  Home Medications   Prior to Admission medications   Medication Sig Start Date End Date Taking? Authorizing Provider  ferrous sulfate 325 (65 FE) MG tablet Take 1 tablet (325 mg total) by mouth daily with breakfast. 10/03/13   Stephanie Couphristopher M Street, MD  FLUoxetine (PROZAC) 20 MG tablet Take 1 tablet (20 mg total) by mouth daily. 10/03/13   Stephanie Couphristopher M Street, MD  ibuprofen (ADVIL,MOTRIN) 800 MG tablet Take 1 tablet (800 mg total) by mouth 3 (three) times daily. 02/25/14   Dahlia ClientHannah Johntae Broxterman, PA-C  levonorgestrel (MIRENA) 20 MCG/24HR IUD 1 each by Intrauterine route once. Pt had it placed on 10/07/11    Historical Provider, MD  sulfamethoxazole-trimethoprim (SEPTRA DS) 800-160 MG  per tablet Take 1 tablet by mouth every 12 (twelve) hours. 12/07/13   Arman Filter, NP   BP 99/59 mmHg  Pulse 88  Temp(Src) 98.5 F (36.9 C)  Resp 18  SpO2 100% Physical Exam  Constitutional: She is oriented to person, place, and time. She appears well-developed and well-nourished. No distress.  Awake, alert, nontoxic appearance  HENT:  Head: Normocephalic and atraumatic.  Right Ear: Tympanic membrane,  external ear and ear canal normal.  Left Ear: Tympanic membrane, external ear and ear canal normal.  Nose: Nose normal. No mucosal edema or rhinorrhea. No epistaxis. Right sinus exhibits no maxillary sinus tenderness and no frontal sinus tenderness. Left sinus exhibits no maxillary sinus tenderness and no frontal sinus tenderness.  Mouth/Throat: Uvula is midline and mucous membranes are normal. Mucous membranes are not pale and not cyanotic. Posterior oropharyngeal erythema present. No oropharyngeal exudate, posterior oropharyngeal edema or tonsillar abscesses.  Mild posterior erythema without edema or exudate  Eyes: Conjunctivae and EOM are normal. Pupils are equal, round, and reactive to light. No scleral icterus.  Neck: Normal range of motion and full passive range of motion without pain. Neck supple. No tracheal deviation present.  Cardiovascular: Normal rate, regular rhythm, normal heart sounds and intact distal pulses.   Pulmonary/Chest: Effort normal and breath sounds normal. No stridor. No respiratory distress. She has no wheezes.  Clear and equal breath sounds without focal wheezes, rhonchi, rales  Abdominal: Soft. Bowel sounds are normal. She exhibits no distension and no mass. There is no tenderness. There is no rebound and no guarding.  Musculoskeletal: Normal range of motion. She exhibits no edema.  Lymphadenopathy:    She has no cervical adenopathy.  Neurological: She is alert and oriented to person, place, and time.  Speech is clear and goal oriented Moves extremities without ataxia  Skin: Skin is warm and dry. No rash noted. She is not diaphoretic. No erythema.  Psychiatric: She has a normal mood and affect. Her behavior is normal.  Nursing note and vitals reviewed.   ED Course  Procedures (including critical care time)  DIAGNOSTIC STUDIES: Oxygen Saturation is 100% on RA, normal by my interpretation.    COORDINATION OF CARE:  4:27 PM Patient advised to drink fluids and  rest.  Patient advised of return precautions.  Patient acknowledges and agrees with plan.    Labs Review Labs Reviewed - No data to display  Imaging Review No results found.   EKG Interpretation None      MDM   Final diagnoses:  Viral illness   Sylvia Green presents with ssx consistent with viral illness.  Pt without fever, cough, wheezing or congestion.  Clear lung sounds, no indication of PNA; no CXR indicated.   As patients symptoms are consistent viral etiology no abx will be given. Discussed that antibiotics are not indicated for viral infections and pt is in agreement with this. Pt will be discharged with symptomatic treatment.  Verbalizes understanding and is agreeable with plan. Pt is hemodynamically stable & in NAD prior to dc.  I have personally reviewed patient's vitals, nursing note and any pertinent labs or imaging.  I performed an focused physical exam; undressed when appropriate .    It has been determined that no acute conditions requiring further emergency intervention are present at this time. The patient/guardian have been advised of the diagnosis and plan. I reviewed any labs and imaging including any potential incidental findings. We have discussed signs and symptoms that warrant return to  the ED and they are listed in the discharge instructions.    Vital signs are stable at discharge. Pt with borderline tachycardia at triage, but no tachycardia on my exam.  BP 99/59 mmHg  Pulse 88  Temp(Src) 98.5 F (36.9 C)  Resp 18  SpO2 100%   I personally performed the services described in this documentation, which was scribed in my presence. The recorded information has been reviewed and is accurate.    Dahlia Client Tore Carreker, PA-C 02/25/14 1610  Mirian Mo, MD 02/28/14 [phone]

## 2014-02-25 NOTE — ED Notes (Signed)
Declined W/C at D/C and was escorted to lobby by RN. 

## 2014-02-26 ENCOUNTER — Encounter (HOSPITAL_COMMUNITY): Payer: Self-pay

## 2014-02-26 ENCOUNTER — Emergency Department (HOSPITAL_COMMUNITY)
Admission: EM | Admit: 2014-02-26 | Discharge: 2014-02-26 | Disposition: A | Discharge status: Home / Self Care | Payer: Medicaid Other | Attending: Emergency Medicine | Admitting: Emergency Medicine

## 2014-02-26 DIAGNOSIS — B349 Viral infection, unspecified: Secondary | ICD-10-CM | POA: Insufficient documentation

## 2014-02-26 DIAGNOSIS — Z792 Long term (current) use of antibiotics: Secondary | ICD-10-CM | POA: Diagnosis not present

## 2014-02-26 DIAGNOSIS — Z79899 Other long term (current) drug therapy: Secondary | ICD-10-CM | POA: Diagnosis not present

## 2014-02-26 DIAGNOSIS — R000 Tachycardia, unspecified: Secondary | ICD-10-CM | POA: Insufficient documentation

## 2014-02-26 DIAGNOSIS — Z8619 Personal history of other infectious and parasitic diseases: Secondary | ICD-10-CM | POA: Insufficient documentation

## 2014-02-26 DIAGNOSIS — R519 Headache, unspecified: Secondary | ICD-10-CM

## 2014-02-26 DIAGNOSIS — R51 Headache: Secondary | ICD-10-CM | POA: Diagnosis present

## 2014-02-26 NOTE — Discharge Instructions (Signed)
Rest, stay well-hydrated. You may take ibuprofen or Tylenol for headache and body aches.  Viral Infections A viral infection can be caused by different types of viruses.Most viral infections are not serious and resolve on their own. However, some infections may cause severe symptoms and may lead to further complications. SYMPTOMS Viruses can frequently cause:  Minor sore throat.  Aches and pains.  Headaches.  Runny nose.  Different types of rashes.  Watery eyes.  Tiredness.  Cough.  Loss of appetite.  Gastrointestinal infections, resulting in nausea, vomiting, and diarrhea. These symptoms do not respond to antibiotics because the infection is not caused by bacteria. However, you might catch a bacterial infection following the viral infection. This is sometimes called a "superinfection." Symptoms of such a bacterial infection may include:  Worsening sore throat with pus and difficulty swallowing.  Swollen neck glands.  Chills and a high or persistent fever.  Severe headache.  Tenderness over the sinuses.  Persistent overall ill feeling (malaise), muscle aches, and tiredness (fatigue).  Persistent cough.  Yellow, green, or brown mucus production with coughing. HOME CARE INSTRUCTIONS   Only take over-the-counter or prescription medicines for pain, discomfort, diarrhea, or fever as directed by your caregiver.  Drink enough water and fluids to keep your urine clear or pale yellow. Sports drinks can provide valuable electrolytes, sugars, and hydration.  Get plenty of rest and maintain proper nutrition. Soups and broths with crackers or rice are fine. SEEK IMMEDIATE MEDICAL CARE IF:   You have severe headaches, shortness of breath, chest pain, neck pain, or an unusual rash.  You have uncontrolled vomiting, diarrhea, or you are unable to keep down fluids.  You or your child has an oral temperature above 102 F (38.9 C), not controlled by medicine.  Your baby is  older than 3 months with a rectal temperature of 102 F (38.9 C) or higher.  Your baby is 133 months old or younger with a rectal temperature of 100.4 F (38 C) or higher. MAKE SURE YOU:   Understand these instructions.  Will watch your condition.  Will get help right away if you are not doing well or get worse. Document Released: 10/14/2004 Document Revised: 03/29/2011 Document Reviewed: 05/11/2010 [facility] Patient Information 2015 [place], [place]. This information is not intended to replace advice given to you by your health care provider. Make sure you discuss any questions you have with your health care provider. Tension Headache A tension headache is a feeling of pain, pressure, or aching often felt over the front and sides of the head. The pain can be dull or can feel tight (constricting). It is the most common type of headache. Tension headaches are not normally associated with nausea or vomiting and do not get worse with physical activity. Tension headaches can last 30 minutes to several days.  CAUSES  The exact cause is not known, but it may be caused by chemicals and hormones in the brain that lead to pain. Tension headaches often begin after stress, anxiety, or depression. Other triggers may include:  Alcohol.  Caffeine (too much or withdrawal).  Respiratory infections (colds, flu, sinus infections).  Dental problems or teeth clenching.  Fatigue.  Holding your head and neck in one position too long while using a computer. SYMPTOMS   Pressure around the head.   Dull, aching head pain.   Pain felt over the front and sides of the head.   Tenderness in the muscles of the head, neck, and shoulders. DIAGNOSIS  A tension  headache is often diagnosed based on:   Symptoms.   Physical examination.   A CT scan or MRI of your head. These tests may be ordered if symptoms are severe or unusual. TREATMENT  Medicines may be given to help relieve symptoms.  HOME CARE  INSTRUCTIONS   Only take over-the-counter or prescription medicines for pain or discomfort as directed by your caregiver.   Lie down in a dark, quiet room when you have a headache.   Keep a journal to find out what may be triggering your headaches. For example, write down:  What you eat and drink.  How much sleep you get.  Any change to your diet or medicines.  Try massage or other relaxation techniques.   Ice packs or heat applied to the head and neck can be used. Use these 3 to 4 times per day for 15 to 20 minutes each time, or as needed.   Limit stress.   Sit up straight, and do not tense your muscles.   Quit smoking if you smoke.  Limit alcohol use.  Decrease the amount of caffeine you drink, or stop drinking caffeine.  Eat and exercise regularly.  Get 7 to 9 hours of sleep, or as recommended by your caregiver.  Avoid excessive use of pain medicine as recurrent headaches can occur.  SEEK MEDICAL CARE IF:   You have problems with the medicines you were prescribed.  Your medicines do not work.  You have a change from the usual headache.  You have nausea or vomiting. SEEK IMMEDIATE MEDICAL CARE IF:   Your headache becomes severe.  You have a fever.  You have a stiff neck.  You have loss of vision.  You have muscular weakness or loss of muscle control.  You lose your balance or have trouble walking.  You feel faint or pass out.  You have severe symptoms that are different from your first symptoms. MAKE SURE YOU:   Understand these instructions.  Will watch your condition.  Will get help right away if you are not doing well or get worse. Document Released: 01/04/2005 Document Revised: 03/29/2011 Document Reviewed: 12/25/2010 [facility] Patient Information 2015 [place], [place]. This information is not intended to replace advice given to you by your health care provider. Make sure you discuss any questions you have with your health care  provider.

## 2014-02-26 NOTE — ED Provider Notes (Signed)
CSN: [id number]     Arrival date & time 02/26/14  1215 History  This chart was scribed for non-physician practitioner, Gianfranco Araki Hessworking with Enid Skeens, MD by Angelene Giovanni, ED Scribe. The patient was seen in room WTR5/WTR5 and the patient's care was started at 12:42 PM    Chief Complaint  Patient presents with  . Headache   The history is provided by the patient. No language interpreter was used.   HPI Comments: Sylvia Green is a 19 y.o. female who presents to the Emergency Department complaining of a constant  frontal HA onset 2 days ago. Patient was diagnosed with a viral illness yesterday and is here today because of her HA. She reports associated body aches, mild sore throat, and body "burning up". She denies photophobia, visual disturbances, chills, N/V/D. She states that she has been taking Ibuprofen with slight relief of her headache and other symptoms.  Past Medical History  Diagnosis Date  . Gonorrhea    Past Surgical History  Procedure Laterality Date  . No past surgeries     Family History  Problem Relation Age of Onset  . Hypertension Mother   . Depression Mother   . Anxiety disorder Mother   . Hyperlipidemia Mother    History  Substance Use Topics  . Smoking status: Never Smoker   . Smokeless tobacco: Never Used  . Alcohol Use: No   OB History    Gravida Para Term Preterm AB TAB SAB Ectopic Multiple Living   Review of Systems  HENT: Positive for sore throat.   Eyes: Negative for photophobia and visual disturbance.  Gastrointestinal: Negative for nausea, vomiting and diarrhea.  Musculoskeletal: Positive for myalgias and arthralgias.  Neurological: Positive for headaches.      Allergies  Review of patient's allergies indicates no known allergies.  Home Medications   Prior to Admission medications   Medication Sig Start Date End Date Taking? Authorizing Provider  ferrous sulfate 325 (65 FE) MG tablet Take 1 tablet (325 mg  total) by mouth daily with breakfast. 10/03/13   Stephanie Coup Street, MD  FLUoxetine (PROZAC) 20 MG tablet Take 1 tablet (20 mg total) by mouth daily. 10/03/13   Stephanie Coup Street, MD  ibuprofen (ADVIL,MOTRIN) 800 MG tablet Take 1 tablet (800 mg total) by mouth 3 (three) times daily. 02/25/14   Dahlia Client Muthersbaugh, PA-C  levonorgestrel (MIRENA) 20 MCG/24HR IUD 1 each by Intrauterine route once. Pt had it placed on 10/07/11    Historical Provider, MD  sulfamethoxazole-trimethoprim (SEPTRA DS) 800-160 MG per tablet Take 1 tablet by mouth every 12 (twelve) hours. 12/07/13   Arman Filter, NP   BP 104/61 mmHg  Pulse 106  Temp(Src) 98.9 F (37.2 C) (Oral)  Resp 16  SpO2 99% Physical Exam  Constitutional: She is oriented to person, place, and time. She appears well-developed and well-nourished. No distress.  HENT:  Head: Normocephalic and atraumatic.  Mouth/Throat: Oropharynx is clear and moist.  Very mild post-oropharyngeal erythema. No edema or exudate. Uvula midline.  Eyes: Conjunctivae and EOM are normal. Pupils are equal, round, and reactive to light.  Neck: Normal range of motion. Neck supple.  No meningeal signs.  Cardiovascular: Normal rate, regular rhythm, normal heart sounds and intact distal pulses.   Mild tachy ~100.  Pulmonary/Chest: Effort normal and breath sounds normal. No respiratory distress.  Abdominal: Soft. Bowel sounds are normal. There is no tenderness.  Musculoskeletal:  Normal range of motion. She exhibits no edema.  Lymphadenopathy:    She has no cervical adenopathy.  Neurological: She is alert and oriented to person, place, and time. She has normal strength. No cranial nerve deficit or sensory deficit. Coordination and gait normal.  Skin: Skin is warm and dry. No rash noted. She is not diaphoretic.  Psychiatric: She has a normal mood and affect. Her behavior is normal.  Nursing note and vitals reviewed.   ED Course  Procedures (including critical care  time) DIAGNOSTIC STUDIES: Oxygen Saturation is 99% on RA, normal by my interpretation.    COORDINATION OF CARE: 12:47 PM- Pt advised of plan for treatment and pt agrees.    Labs Review Labs Reviewed - No data to display  Imaging Review No results found.   EKG Interpretation None      MDM   Final diagnoses:  Viral illness  Frontal headache   Patient nontoxic appearing and in no apparent distress. Afebrile. Mild tachycardia, vitals otherwise stable. Other then mild post oropharyngeal erythema, no acute findings on exam. Her symptoms are consistent with a viral illness, somewhat relieved by ibuprofen. No red flags concerning patient's headache. No focal neurologic deficits. Doubt SAH, ICH, CVA or meningitis. Discussed continued symptomatic treatment. Stable for discharge. Return precautions given. Patient states understanding of treatment care plan and is agreeable.  I personally performed the services described in this documentation, which was scribed in my presence. The recorded information has been reviewed and is accurate.  Kathrynn SpeedRobyn M Tenaya Hilyer, PA-C 02/26/14 1254  Enid SkeensJoshua M Zavitz, MD 02/26/14 [phone]

## 2014-02-26 NOTE — ED Notes (Signed)
Pt c/o headache x 2 days.  Pain score 6/10.  Pt reports that ibuprofen "eases it down."  [facility] and diagnosed w/ a virus.

## 2014-04-18 ENCOUNTER — Emergency Department (HOSPITAL_COMMUNITY)
Admission: EM | Admit: 2014-04-18 | Discharge: 2014-04-18 | Disposition: A | Discharge status: Home / Self Care | Payer: Medicaid Other | Attending: Emergency Medicine | Admitting: Emergency Medicine

## 2014-04-18 ENCOUNTER — Encounter (HOSPITAL_COMMUNITY): Payer: Self-pay | Admitting: Emergency Medicine

## 2014-04-18 DIAGNOSIS — Y9289 Other specified places as the place of occurrence of the external cause: Secondary | ICD-10-CM | POA: Diagnosis not present

## 2014-04-18 DIAGNOSIS — Y998 Other external cause status: Secondary | ICD-10-CM | POA: Diagnosis not present

## 2014-04-18 DIAGNOSIS — S93401A Sprain of unspecified ligament of right ankle, initial encounter: Secondary | ICD-10-CM | POA: Insufficient documentation

## 2014-04-18 DIAGNOSIS — Z8619 Personal history of other infectious and parasitic diseases: Secondary | ICD-10-CM | POA: Diagnosis not present

## 2014-04-18 DIAGNOSIS — X58XXXA Exposure to other specified factors, initial encounter: Secondary | ICD-10-CM | POA: Diagnosis not present

## 2014-04-18 DIAGNOSIS — Y9389 Activity, other specified: Secondary | ICD-10-CM | POA: Insufficient documentation

## 2014-04-18 DIAGNOSIS — M25571 Pain in right ankle and joints of right foot: Secondary | ICD-10-CM | POA: Diagnosis present

## 2014-04-18 MED ORDER — NAPROXEN 375 MG PO TABS: 375.0000 mg | ORAL_TABLET | Freq: Two times a day (BID) | ORAL | Status: DC

## 2014-04-18 NOTE — Discharge Instructions (Signed)

## 2014-04-18 NOTE — ED Provider Notes (Signed)
CSN: [id number]     Arrival date & time 04/18/14  1754 History  This chart was scribed for non-physician practitioner, Roxy Horsemanobert Kenley Rettinger, PA-C, working with Mancel BaleElliott Wentz, MD, by Modena JanskyAlbert Thayil, ED Scribe. This patient was seen in room WTR3/WLPT3 and the patient's care was started at 6:32 PM.   Chief Complaint  Patient presents with  . Ankle Pain   The history is provided by the patient. No language interpreter was used.   HPI Comments: Sylvia Green is a 19 y.o. female who presents to the Emergency Department complaining of constant moderate right ankle pain that started yesterday. She states that the pain started yesterday after she rolled her right ankle. She reports that twisting the right ankle a certain way exacerbates the pain. She states no treatment PTA. She denies any difficulty ambulating.   Past Medical History  Diagnosis Date  . Gonorrhea    Past Surgical History  Procedure Laterality Date  . No past surgeries     Family History  Problem Relation Age of Onset  . Hypertension Mother   . Depression Mother   . Anxiety disorder Mother   . Hyperlipidemia Mother    History  Substance Use Topics  . Smoking status: Never Smoker   . Smokeless tobacco: Never Used  . Alcohol Use: No   OB History    Gravida Para Term Preterm AB TAB SAB Ectopic Multiple Living   2 2 2       2      Review of Systems  Constitutional: Negative for fever and chills.  Respiratory: Negative for shortness of breath.   Cardiovascular: Negative for chest pain.  Gastrointestinal: Negative for nausea, vomiting, diarrhea and constipation.  Genitourinary: Negative for dysuria.  Musculoskeletal: Positive for myalgias and arthralgias. Negative for gait problem.    Allergies  Review of patient's allergies indicates no known allergies.  Home Medications   Prior to Admission medications   Medication Sig Start Date End Date Taking? Authorizing Provider  ferrous sulfate 325 (65 FE) MG tablet Take 1  tablet (325 mg total) by mouth daily with breakfast. 10/03/13   Stephanie Couphristopher M Street, MD  FLUoxetine (PROZAC) 20 MG tablet Take 1 tablet (20 mg total) by mouth daily. 10/03/13   Stephanie Couphristopher M Street, MD  ibuprofen (ADVIL,MOTRIN) 800 MG tablet Take 1 tablet (800 mg total) by mouth 3 (three) times daily. 02/25/14   Dahlia ClientHannah Muthersbaugh, PA-C  levonorgestrel (MIRENA) 20 MCG/24HR IUD 1 each by Intrauterine route once. Pt had it placed on 10/07/11    Historical Provider, MD  sulfamethoxazole-trimethoprim (SEPTRA DS) 800-160 MG per tablet Take 1 tablet by mouth every 12 (twelve) hours. 12/07/13   Earley FavorGail Schulz, NP   BP 114/71 mmHg  Pulse 91  Temp(Src) 98.5 F (36.9 C) (Oral)  Resp 16  SpO2 100% Physical Exam  Constitutional: She is oriented to person, place, and time. She appears well-developed and well-nourished.  HENT:  Head: Normocephalic and atraumatic.  Eyes: Conjunctivae and EOM are normal.  Neck: Normal range of motion.  Cardiovascular: Normal rate and intact distal pulses.   Intact distal pulses with brisk capillary refill  Pulmonary/Chest: Effort normal.  Abdominal: She exhibits no distension.  Musculoskeletal: Normal range of motion.  Very mild tenderness to palpation over the ATFL, no tenderness to palpation over the lateral or medial malleoli, range of motion strength of ankle and foot is 5/5, patient is able to ambulate without limping, no bony abnormality or deformity  Neurological: She is alert and oriented to person,  place, and time.  Sensation intact  Skin: Skin is dry.  Psychiatric: She has a normal mood and affect. Her behavior is normal. Judgment and thought content normal.  Nursing note and vitals reviewed.   ED Course  Procedures (including critical care time) DIAGNOSTIC STUDIES: Oxygen Saturation is 100% on RA, normal by my interpretation.    COORDINATION OF CARE: 6:36 PM- Pt advised of plan for treatment which includes medication and pt agrees.  Labs Review Labs  Reviewed - No data to display  Imaging Review No results found.   EKG Interpretation None      MDM   Final diagnoses:  Ankle sprain, right, initial encounter    Patient with probable grade 1 ankle sprain after rolling her ankle yesterday. No imaging indicated per [place] ankle rules. Will give ankle brace. Recommend rice therapy, and NSAIDs. Patient has crutches at home. Return precautions given. Patient is stable and ready for discharge.  I personally performed the services described in this documentation, which was scribed in my presence. The recorded information has been reviewed and is accurate.     Roxy Horseman, PA-C 04/18/14 1841  Mancel Bale, MD 04/18/14 [phone]

## 2014-04-18 NOTE — ED Notes (Addendum)
Pt states that she began having rt ankle pain yesterday after "sitting on it too long".  Pt just wants to "make sure it's ok".  Pt ambulated to triage without limp.  Pt is not having any pain.

## 2014-04-19 ENCOUNTER — Other Ambulatory Visit: Payer: Self-pay | Admitting: Family Medicine

## 2014-05-24 ENCOUNTER — Ambulatory Visit (INDEPENDENT_AMBULATORY_CARE_PROVIDER_SITE_OTHER): Payer: Medicaid Other | Admitting: Family Medicine

## 2014-05-24 ENCOUNTER — Encounter: Payer: Self-pay | Admitting: Family Medicine

## 2014-05-24 VITALS — BP 107/64 | HR 80 | Wt 137.0 lb

## 2014-05-24 DIAGNOSIS — L301 Dyshidrosis [pompholyx]: Secondary | ICD-10-CM

## 2014-05-24 MED ORDER — TRIAMCINOLONE ACETONIDE 0.1 % EX CREA: 1.0000 "application " | TOPICAL_CREAM | Freq: Two times a day (BID) | CUTANEOUS | Status: DC

## 2014-05-24 NOTE — Assessment & Plan Note (Addendum)
Looks like classic dyshidrotic eczema - Will try kenalog cream 0.1% BID when has outbreak - Ongoing now for several years (this is not a SDA appropriate) - If no improvement on Kenalog, consider punch Bx.

## 2014-05-24 NOTE — Progress Notes (Signed)
Maeola Harmanliyah Quinlivan is a 19 y.o. female who presents today for rash R hand  R hand rash - Ongoing now for several years, previously seen in urgent care for outbreak, given doxy and lotrim without improvement.  Itchy at times but does come in fluctuations.  Denies having it on other parts of her body and does get worse with diaphoresis.  Does have some skin peeling as well.    Past Medical History  Diagnosis Date  . Gonorrhea     History  Smoking status  . Never Smoker   Smokeless tobacco  . Never Used    Family History  Problem Relation Age of Onset  . Hypertension Mother   . Depression Mother   . Anxiety disorder Mother   . Hyperlipidemia Mother     Current Outpatient Prescriptions on File Prior to Visit  Medication Sig Dispense Refill  . ferrous sulfate 325 (65 FE) MG tablet Take 1 tablet (325 mg total) by mouth daily with breakfast. 30 tablet 3  . ibuprofen (ADVIL,MOTRIN) 800 MG tablet Take 1 tablet (800 mg total) by mouth 3 (three) times daily. 21 tablet 0  . levonorgestrel (MIRENA) 20 MCG/24HR IUD 1 each by Intrauterine route once. Pt had it placed on 10/07/11    . naproxen (NAPROSYN) 375 MG tablet Take 1 tablet (375 mg total) by mouth 2 (two) times daily. 20 tablet 0   No current facility-administered medications on file prior to visit.    ROS: Per HPI.  All other systems reviewed and are negative.   Physical Exam Filed Vitals:   05/24/14 1125  BP: 107/64  Pulse: 80    Physical Examination: Skin - R hand - maculopapular rash on dorsum/palmar aspect of hand with lichenification and flaking

## 2014-05-30 NOTE — Progress Notes (Signed)
I was preceptor for this office visit.  

## 2014-06-18 ENCOUNTER — Encounter (HOSPITAL_COMMUNITY): Payer: Self-pay | Admitting: Emergency Medicine

## 2014-06-18 ENCOUNTER — Emergency Department (HOSPITAL_COMMUNITY)
Admission: EM | Admit: 2014-06-18 | Discharge: 2014-06-18 | Disposition: A | Discharge status: Home / Self Care | Payer: Medicaid Other | Attending: Emergency Medicine | Admitting: Emergency Medicine

## 2014-06-18 DIAGNOSIS — Z79899 Other long term (current) drug therapy: Secondary | ICD-10-CM | POA: Diagnosis not present

## 2014-06-18 DIAGNOSIS — R1030 Lower abdominal pain, unspecified: Secondary | ICD-10-CM

## 2014-06-18 DIAGNOSIS — Z3202 Encounter for pregnancy test, result negative: Secondary | ICD-10-CM | POA: Diagnosis not present

## 2014-06-18 DIAGNOSIS — Z8619 Personal history of other infectious and parasitic diseases: Secondary | ICD-10-CM | POA: Diagnosis not present

## 2014-06-18 DIAGNOSIS — R350 Frequency of micturition: Secondary | ICD-10-CM | POA: Diagnosis not present

## 2014-06-18 DIAGNOSIS — N730 Acute parametritis and pelvic cellulitis: Secondary | ICD-10-CM | POA: Diagnosis not present

## 2014-06-18 LAB — URINALYSIS, ROUTINE W REFLEX MICROSCOPIC
Bilirubin Urine: NEGATIVE
Glucose, UA: NEGATIVE mg/dL
KETONES UR: NEGATIVE mg/dL
Nitrite: POSITIVE — AB
PH: 5 (ref 5.0–8.0)
Protein, ur: NEGATIVE mg/dL
SPECIFIC GRAVITY, URINE: 1.025 (ref 1.005–1.030)
Urobilinogen, UA: 1 mg/dL (ref 0.0–1.0)

## 2014-06-18 LAB — POC URINE PREG, ED: Preg Test, Ur: NEGATIVE

## 2014-06-18 LAB — CBC WITH DIFFERENTIAL/PLATELET
Basophils Absolute: 0 10*3/uL (ref 0.0–0.1)
Basophils Relative: 0 % (ref 0–1)
Eosinophils Absolute: 0.1 10*3/uL (ref 0.0–0.7)
Eosinophils Relative: 1 % (ref 0–5)
HCT: 39.5 % (ref 36.0–46.0)
Hemoglobin: 13.5 g/dL (ref 12.0–15.0)
LYMPHS PCT: 13 % (ref 12–46)
Lymphs Abs: 0.9 10*3/uL (ref 0.7–4.0)
MCH: 31.3 pg (ref 26.0–34.0)
MCHC: 34.2 g/dL (ref 30.0–36.0)
MCV: 91.6 fL (ref 78.0–100.0)
Monocytes Absolute: 0.3 10*3/uL (ref 0.1–1.0)
Monocytes Relative: 5 % (ref 3–12)
NEUTROS ABS: 5.8 10*3/uL (ref 1.7–7.7)
Neutrophils Relative %: 81 % — ABNORMAL HIGH (ref 43–77)
PLATELETS: 162 10*3/uL (ref 150–400)
RBC: 4.31 MIL/uL (ref 3.87–5.11)
RDW: 13.2 % (ref 11.5–15.5)
WBC: 7.1 10*3/uL (ref 4.0–10.5)

## 2014-06-18 LAB — COMPREHENSIVE METABOLIC PANEL
ALK PHOS: 70 U/L (ref 38–126)
ALT: 16 U/L (ref 14–54)
AST: 19 U/L (ref 15–41)
Albumin: 4.1 g/dL (ref 3.5–5.0)
Anion gap: 7 (ref 5–15)
BUN: 10 mg/dL (ref 6–20)
CALCIUM: 9.1 mg/dL (ref 8.9–10.3)
CO2: 25 mmol/L (ref 22–32)
CREATININE: 0.68 mg/dL (ref 0.44–1.00)
Chloride: 107 mmol/L (ref 101–111)
GFR calc Af Amer: >60 mL/min (ref >60)
GFR calc non Af Amer: >60 mL/min (ref >60)
Glucose, Bld: 79 mg/dL (ref 65–99)
Potassium: 3.6 mmol/L (ref 3.5–5.1)
Sodium: 139 mmol/L (ref 135–145)
Total Bilirubin: 0.4 mg/dL (ref 0.3–1.2)
Total Protein: 7.6 g/dL (ref 6.5–8.1)

## 2014-06-18 LAB — WET PREP, GENITAL
Trich, Wet Prep: NONE SEEN
Yeast Wet Prep HPF POC: NONE SEEN

## 2014-06-18 LAB — URINE MICROSCOPIC-ADD ON

## 2014-06-18 MED ORDER — METRONIDAZOLE 500 MG PO TABS
2000.0000 mg | ORAL_TABLET | Freq: Once | ORAL | Status: AC
Start: 2014-06-18 — End: 2014-06-18
  Administered 2014-06-18: 2000 mg via ORAL
  Filled 2014-06-18: qty 4

## 2014-06-18 MED ORDER — LIDOCAINE HCL (PF) 1 % IJ SOLN
INTRAMUSCULAR | Status: AC
  Administered 2014-06-18: 1 mL
  Filled 2014-06-18: qty 5

## 2014-06-18 MED ORDER — AZITHROMYCIN 250 MG PO TABS
1000.0000 mg | ORAL_TABLET | Freq: Once | ORAL | Status: AC
  Administered 2014-06-18: 1000 mg via ORAL
  Filled 2014-06-18: qty 4

## 2014-06-18 MED ORDER — CEFTRIAXONE SODIUM 250 MG IJ SOLR
250.0000 mg | Freq: Once | INTRAMUSCULAR | Status: AC
  Administered 2014-06-18: 250 mg via INTRAMUSCULAR
  Filled 2014-06-18: qty 250

## 2014-06-18 MED ORDER — DOXYCYCLINE HYCLATE 100 MG PO CAPS: 100.0000 mg | ORAL_CAPSULE | Freq: Two times a day (BID) | ORAL | Status: DC

## 2014-06-18 NOTE — ED Notes (Addendum)
Pt c/o lower abd pain that is intermittent. Pt states that pain has been going on "for a while".  Pt denies any n/v/d, vaginal or urinary problems.  Pt states that not sure what makes it hurt or feel better.  Pt started her period last Thursday and is currently still on.  Pt describes the pain now as cramps.

## 2014-06-18 NOTE — ED Provider Notes (Signed)
CSN: [id number]     Arrival date & time 06/18/14  1555 History   First MD Initiated Contact with Patient 06/18/14 1723     Chief Complaint  Patient presents with  . Abdominal Pain     (Consider location/radiation/quality/duration/timing/severity/associated sxs/prior Treatment) The history is provided by the patient and medical records. No language interpreter was used.     Sylvia Green is a 19 y.o. female  with a hx of STD presents to the Emergency Department complaining of intermittent lower abdominal pain onset several months ago and reoccurring this morning.  Pt reports pain is a 3/10, radiates to her lower back and described as sometimes sharp and sometimes crampy.  She reports the pain always feels the same when it comes.  She reports it usually lasts 1 full day and the resolves spontaneously.  She denies any treatments PTA.  Pt reports 1 female sexual partner. Pt reports hx of Gonorrhea and chlamydia infection that was treated.  She denies vaginal discharge, nausea, vomiting, diarrhea, dysuria, hematuria.  Pt reports increased urinary frequency today. Nothing makes it better or worse  Pt denies fever, chills, headache, neck pain, chest pain, shortness of breath, nausea, vomiting, diarrhea weakness, dizziness, syncope, dysuria, hematuria.  LMP: 06/12/24   Past Medical History  Diagnosis Date  . Gonorrhea    Past Surgical History  Procedure Laterality Date  . No past surgeries     Family History  Problem Relation Age of Onset  . Hypertension Mother   . Depression Mother   . Anxiety disorder Mother   . Hyperlipidemia Mother    History  Substance Use Topics  . Smoking status: Never Smoker   . Smokeless tobacco: Never Used  . Alcohol Use: No   OB History    Gravida Para Term Preterm AB TAB SAB Ectopic Multiple Living   Review of Systems  Constitutional: Negative for fever, diaphoresis, appetite change, fatigue and unexpected weight change.  HENT: Negative  for mouth sores and trouble swallowing.   Respiratory: Negative for cough, chest tightness, shortness of breath, wheezing and stridor.   Cardiovascular: Negative for chest pain and palpitations.  Gastrointestinal: Positive for abdominal pain ( lower). Negative for nausea, vomiting, diarrhea, constipation, blood in stool, abdominal distention and rectal pain.  Genitourinary: Positive for frequency. Negative for dysuria, urgency, hematuria, flank pain and difficulty urinating.  Musculoskeletal: Negative for back pain, neck pain and neck stiffness.  Skin: Negative for rash.  Neurological: Negative for weakness.  Hematological: Negative for adenopathy.  Psychiatric/Behavioral: Negative for confusion.  All other systems reviewed and are negative.     Allergies  Review of patient's allergies indicates no known allergies.  Home Medications   Prior to Admission medications   Medication Sig Start Date End Date Taking? Authorizing Provider  ferrous sulfate 325 (65 FE) MG tablet Take 1 tablet (325 mg total) by mouth daily with breakfast. 10/03/13  Yes Stephanie Coup Street, MD  doxycycline (VIBRAMYCIN) 100 MG capsule Take 1 capsule (100 mg total) by mouth 2 (two) times daily. 06/18/14   Loyed Wilmes, PA-C  ibuprofen (ADVIL,MOTRIN) 800 MG tablet Take 1 tablet (800 mg total) by mouth 3 (three) times daily. Patient not taking: Reported on 06/18/2014 02/25/14   Dahlia Client Damoni Erker, PA-C  levonorgestrel (MIRENA) 20 MCG/24HR IUD 1 each by Intrauterine route once. Pt had it placed on 10/07/11    Historical Provider, MD  naproxen (NAPROSYN) 375 MG tablet Take  1 tablet (375 mg total) by mouth 2 (two) times daily. Patient not taking: Reported on 06/18/2014 04/18/14   Roxy Horseman, PA-C  triamcinolone cream (KENALOG) 0.1 % Apply 1 application topically 2 (two) times daily. Patient not taking: Reported on 06/18/2014 05/24/14   Twana First Hess, DO   BP 101/55 mmHg  Pulse 91  Temp(Src) 98.4 F (36.9 C) (Oral)   Resp 17  SpO2 100%  LMP 05/09/2014 Physical Exam  Constitutional: She appears well-developed and well-nourished. No distress.  HENT:  Head: Normocephalic and atraumatic.  Mouth/Throat: Oropharynx is clear and moist.  Eyes: Conjunctivae are normal. No scleral icterus.  Neck: Normal range of motion.  Cardiovascular: Normal rate, regular rhythm, normal heart sounds and intact distal pulses.   No murmur heard. Pulmonary/Chest: Effort normal and breath sounds normal. No respiratory distress. She has no wheezes.  Abdominal: Soft. Bowel sounds are normal. She exhibits no distension and no mass. There is tenderness in the suprapubic area. There is no rebound, no guarding and no CVA tenderness. Hernia confirmed negative in the right inguinal area and confirmed negative in the left inguinal area.  Suprapubic abdominal tenderness without guarding, rebound or peritoneal signs No CVA tenderness  Genitourinary: Uterus normal. No labial fusion. There is no rash, tenderness or lesion on the right labia. There is no rash, tenderness or lesion on the left labia. Uterus is not deviated, not enlarged, not fixed and not tender. Cervix exhibits motion tenderness. Cervix exhibits no discharge and no friability. Right adnexum displays tenderness. Right adnexum displays no mass and no fullness. Left adnexum displays tenderness. Left adnexum displays no mass and no fullness. There is bleeding in the vagina. No erythema or tenderness in the vagina. No foreign body around the vagina. No signs of injury around the vagina. No vaginal discharge found.  Blood in the vaginal vault Very mild CMT and bilateral adnexal tenderness without chandelier sign; no masses  Musculoskeletal: Normal range of motion. She exhibits no edema.  Lymphadenopathy:       Right: No inguinal adenopathy present.       Left: No inguinal adenopathy present.  Neurological: She is alert.  Skin: Skin is warm and dry. She is not diaphoretic. No erythema.   Psychiatric: She has a normal mood and affect.  Nursing note and vitals reviewed.   ED Course  Procedures (including critical care time) Labs Review Labs Reviewed  WET PREP, GENITAL - Abnormal; Notable for the following:    Clue Cells Wet Prep HPF POC MODERATE (*)    WBC, Wet Prep HPF POC FEW (*)    All other components within normal limits  CBC WITH DIFFERENTIAL/PLATELET - Abnormal; Notable for the following:    Neutrophils Relative % 81 (*)    All other components within normal limits  URINALYSIS, ROUTINE W REFLEX MICROSCOPIC (NOT AT [facility]) - Abnormal; Notable for the following:    Color, Urine AMBER (*)    APPearance CLOUDY (*)    Hgb urine dipstick LARGE (*)    Nitrite POSITIVE (*)    Leukocytes, UA SMALL (*)    All other components within normal limits  URINE MICROSCOPIC-ADD ON - Abnormal; Notable for the following:    Squamous Epithelial / LPF MANY (*)    Bacteria, UA MANY (*)    All other components within normal limits  URINE CULTURE  COMPREHENSIVE METABOLIC PANEL  POC URINE PREG, ED  GC/CHLAMYDIA PROBE AMP ([place]) NOT AT [facility]    Imaging Review No results found.  EKG Interpretation None      MDM   Final diagnoses:  Lower abdominal pain  PID (acute pelvic inflammatory disease)   Sylvia Green presents with suprapubic abdominal pain onset this morning. She's had intermittent symptoms for several months.  She is well appearing, non-tachycardic, afebrile and without hypotension. No reported vomiting and emesis here in the department. Patient without leukocytosis. Urinalysis with positive nitrites and leukocytes however patient only endorses urinary frequency and denies dysuria or urinary urgency. The sample is contaminated and there are only 3-6 white blood cells. Urine culture sent, will hold on antibiotics for UTI at this time.  Patient with very mild cervical motion and bilateral adnexal tenderness on exam without positive chandelier sign. Wet prep with  a few white blood cells and moderate clue cells her she reports that she is concerned that she might have an STD. Will begin treatment for PID. Patient is to follow-up with OB/GYN in one week for further evaluation. Strict return precautions given including fever, chills, nausea and vomiting or other concerning symptoms.  BP 101/55 mmHg  Pulse 91  Temp(Src) 98.4 F (36.9 C) (Oral)  Resp 17  SpO2 100%  LMP 05/09/2014   Dierdre ForthHannah Doral Ventrella, PA-C 06/18/14 1953  Lorre NickAnthony Allen, MD 06/18/14 [phone]

## 2014-06-18 NOTE — Discharge Instructions (Signed)
1. Medications: usual home medications 2. Treatment: rest, drink plenty of fluids, use a condom with every sexual encounter 3. Follow Up: Please followup with your primary doctor in 3 days for discussion of your diagnoses and further evaluation after today's visit; if you do not have a primary care doctor use the resource guide provided to find one; Please return to the ER for worsening symptoms, high fevers or persistent vomiting.  You have been tested for HIV, syphilis, chlamydia and gonorrhea.  These results will be available in approximately 3 days.  Please inform all sexual partners if you test positive for any of these diseases.   Pelvic Inflammatory Disease Pelvic inflammatory disease (PID) refers to an infection in some or all of the female organs. The infection can be in the uterus, ovaries, fallopian tubes, or the surrounding tissues in the pelvis. PID can cause abdominal or pelvic pain that comes on suddenly (acute pelvic pain). PID is a serious infection because it can lead to lasting (chronic) pelvic pain or the inability to have children (infertile).  CAUSES  The infection is often caused by the normal bacteria found in the vaginal tissues. PID may also be caused by an infection that is spread during sexual contact. PID can also occur following:   The birth of a baby.   A miscarriage.   An abortion.   Major pelvic surgery.   The use of an intrauterine device (IUD).   A sexual assault.  RISK FACTORS Certain factors can put a person at higher risk for PID, such as:  Being younger than 25 years.  Being sexually active at [place] age.  Usingnonbarrier contraception.  Havingmultiple sexual partners.  Having sex with someone who has symptoms of a genital infection.  Using oral contraception. Other times, certain behaviors can increase the possibility of getting PID, such as:  Having sex during your period.  Using a vaginal douche.  Having an intrauterine  device (IUD) in place. SYMPTOMS   Abdominal or pelvic pain.   Fever.   Chills.   Abnormal vaginal discharge.  Abnormal uterine bleeding.   Unusual pain shortly after finishing your period. DIAGNOSIS  Your caregiver will choose some of the following methods to make a diagnosis, such as:   Performinga physical exam and history. A pelvic exam typically reveals a very tender uterus and surrounding pelvis.   Ordering laboratory tests including a pregnancy test, blood tests, and urine test.  Orderingcultures of the vagina and cervix to check for a sexually transmitted infection (STI).  Performing an ultrasound.   Performing a laparoscopic procedure to look inside the pelvis.  TREATMENT   Antibiotic medicines may be prescribed and taken by mouth.   Sexual partners may be treated when the infection is caused by a sexually transmitted disease (STD).   Hospitalization may be needed to give antibiotics intravenously.  Surgery may be needed, but this is rare. It may take weeks until you are completely well. If you are diagnosed with PID, you should also be checked for human immunodeficiency virus (HIV). HOME CARE INSTRUCTIONS   If given, take your antibiotics as directed. Finish the medicine even if you start to feel better.   Only take over-the-counter or prescription medicines for pain, discomfort, or fever as directed by your caregiver.   Do not have sexual intercourse until treatment is completed or as directed by your caregiver. If PID is confirmed, your recent sexual partner(s) will need treatment.   Keep your follow-up appointments. SEEK MEDICAL CARE IF:  You have increased or abnormal vaginal discharge.   You need prescription medicine for your pain.   You vomit.   You cannot take your medicines.   Your partner has an STD.  SEEK IMMEDIATE MEDICAL CARE IF:   You have a fever.   You have increased abdominal or pelvic pain.   You have  chills.   You have pain when you urinate.   You are not better after 72 hours following treatment.  MAKE SURE YOU:   Understand these instructions.  Will watch your condition.  Will get help right away if you are not doing well or get worse. Document Released: 01/04/2005 Document Revised: 05/01/2012 Document Reviewed: 12/31/2010 [facility] Patient Information 2015 [place], [place]. This information is not intended to replace advice given to you by your health care provider. Make sure you discuss any questions you have with your health care provider.

## 2014-06-19 LAB — GC/CHLAMYDIA PROBE AMP (~~LOC~~) NOT AT ARMC
CHLAMYDIA, DNA PROBE: POSITIVE — AB
NEISSERIA GONORRHEA: NEGATIVE

## 2014-06-20 ENCOUNTER — Telehealth (HOSPITAL_BASED_OUTPATIENT_CLINIC_OR_DEPARTMENT_OTHER): Payer: Self-pay | Admitting: Emergency Medicine

## 2014-06-21 ENCOUNTER — Ambulatory Visit: Payer: Medicaid Other | Admitting: Family Medicine

## 2014-06-21 ENCOUNTER — Emergency Department (HOSPITAL_COMMUNITY): Payer: Medicaid Other

## 2014-06-21 ENCOUNTER — Encounter (HOSPITAL_COMMUNITY): Payer: Self-pay | Admitting: *Deleted

## 2014-06-21 ENCOUNTER — Emergency Department (HOSPITAL_COMMUNITY)
Admission: EM | Admit: 2014-06-21 | Discharge: 2014-06-21 | Disposition: A | Discharge status: Home / Self Care | Payer: Medicaid Other | Attending: Emergency Medicine | Admitting: Emergency Medicine

## 2014-06-21 DIAGNOSIS — Z30431 Encounter for routine checking of intrauterine contraceptive device: Secondary | ICD-10-CM | POA: Diagnosis not present

## 2014-06-21 DIAGNOSIS — Z3202 Encounter for pregnancy test, result negative: Secondary | ICD-10-CM | POA: Insufficient documentation

## 2014-06-21 DIAGNOSIS — Z792 Long term (current) use of antibiotics: Secondary | ICD-10-CM | POA: Insufficient documentation

## 2014-06-21 DIAGNOSIS — Z7952 Long term (current) use of systemic steroids: Secondary | ICD-10-CM | POA: Insufficient documentation

## 2014-06-21 DIAGNOSIS — A749 Chlamydial infection, unspecified: Secondary | ICD-10-CM

## 2014-06-21 DIAGNOSIS — N730 Acute parametritis and pelvic cellulitis: Secondary | ICD-10-CM | POA: Diagnosis not present

## 2014-06-21 DIAGNOSIS — Z791 Long term (current) use of non-steroidal anti-inflammatories (NSAID): Secondary | ICD-10-CM | POA: Insufficient documentation

## 2014-06-21 DIAGNOSIS — M549 Dorsalgia, unspecified: Secondary | ICD-10-CM | POA: Diagnosis not present

## 2014-06-21 DIAGNOSIS — R109 Unspecified abdominal pain: Secondary | ICD-10-CM

## 2014-06-21 DIAGNOSIS — R1030 Lower abdominal pain, unspecified: Secondary | ICD-10-CM | POA: Diagnosis present

## 2014-06-21 LAB — URINALYSIS, ROUTINE W REFLEX MICROSCOPIC
GLUCOSE, UA: NEGATIVE mg/dL
Ketones, ur: 15 mg/dL — AB
Nitrite: NEGATIVE
PH: 6 (ref 5.0–8.0)
Protein, ur: NEGATIVE mg/dL
Specific Gravity, Urine: 1.025 (ref 1.005–1.030)
Urobilinogen, UA: 1 mg/dL (ref 0.0–1.0)

## 2014-06-21 LAB — CBC WITH DIFFERENTIAL/PLATELET
Basophils Absolute: 0.1 10*3/uL (ref 0.0–0.1)
Basophils Relative: 1 % (ref 0–1)
EOS PCT: 4 % (ref 0–5)
Eosinophils Absolute: 0.2 10*3/uL (ref 0.0–0.7)
HCT: 39.2 % (ref 36.0–46.0)
HEMOGLOBIN: 13.6 g/dL (ref 12.0–15.0)
LYMPHS ABS: 1.3 10*3/uL (ref 0.7–4.0)
Lymphocytes Relative: 28 % (ref 12–46)
MCH: 31.1 pg (ref 26.0–34.0)
MCHC: 34.7 g/dL (ref 30.0–36.0)
MCV: 89.7 fL (ref 78.0–100.0)
Monocytes Absolute: 0.3 10*3/uL (ref 0.1–1.0)
Monocytes Relative: 8 % (ref 3–12)
NEUTROS PCT: 59 % (ref 43–77)
Neutro Abs: 2.7 10*3/uL (ref 1.7–7.7)
PLATELETS: 155 10*3/uL (ref 150–400)
RBC: 4.37 MIL/uL (ref 3.87–5.11)
RDW: 12.8 % (ref 11.5–15.5)
WBC: 4.5 10*3/uL (ref 4.0–10.5)

## 2014-06-21 LAB — COMPREHENSIVE METABOLIC PANEL
ALT: 14 U/L (ref 14–54)
AST: 17 U/L (ref 15–41)
Albumin: 4.1 g/dL (ref 3.5–5.0)
Alkaline Phosphatase: 61 U/L (ref 38–126)
Anion gap: 7 (ref 5–15)
BILIRUBIN TOTAL: 0.4 mg/dL (ref 0.3–1.2)
BUN: 12 mg/dL (ref 6–20)
CALCIUM: 9.2 mg/dL (ref 8.9–10.3)
CHLORIDE: 104 mmol/L (ref 101–111)
CO2: 26 mmol/L (ref 22–32)
CREATININE: 0.77 mg/dL (ref 0.44–1.00)
GFR calc non Af Amer: >60 mL/min (ref >60)
GLUCOSE: 91 mg/dL (ref 65–99)
Potassium: 3.5 mmol/L (ref 3.5–5.1)
Sodium: 137 mmol/L (ref 135–145)
Total Protein: 7.8 g/dL (ref 6.5–8.1)

## 2014-06-21 LAB — URINE MICROSCOPIC-ADD ON

## 2014-06-21 LAB — LIPASE, BLOOD: LIPASE: 28 U/L (ref 22–51)

## 2014-06-21 LAB — URINE CULTURE

## 2014-06-21 LAB — PREGNANCY, URINE: Preg Test, Ur: NEGATIVE

## 2014-06-21 MED ORDER — SODIUM CHLORIDE 0.9 % IV BOLUS (SEPSIS)
1000.0000 mL | Freq: Once | INTRAVENOUS | Status: AC
  Administered 2014-06-21: 1000 mL via INTRAVENOUS

## 2014-06-21 MED ORDER — IOHEXOL 300 MG/ML  SOLN
100.0000 mL | Freq: Once | INTRAMUSCULAR | Status: AC | PRN
  Administered 2014-06-21: 80 mL via INTRAVENOUS

## 2014-06-21 MED ORDER — IOHEXOL 300 MG/ML  SOLN
50.0000 mL | Freq: Once | INTRAMUSCULAR | Status: AC | PRN
  Administered 2014-06-21: 50 mL via ORAL

## 2014-06-21 NOTE — ED Notes (Signed)
Patient not back from radiology

## 2014-06-21 NOTE — ED Notes (Signed)
Patient c/o intermittent lower abdominal pain radiating to bilateral flanks for several months, worsening this week.  Patient states pain is 5/10.  Patient was seen here for this pain and N/V on Tuesday this week.  Patient was prescribed doxycycline and states she has been taking it.  Patient denies N/V/D currently.  Patient reports episodes of feeling warm, but has not measured temperature.  Patient denies vaginal discharge.  Patient denies pain with urination, frequency/urgency and hematuria.

## 2014-06-21 NOTE — ED Provider Notes (Signed)
CSN: [id number]     Arrival date & time 06/21/14  0857 History   First MD Initiated Contact with Patient 06/21/14 [phone]     Chief Complaint  Patient presents with  . Abdominal Pain     (Consider location/radiation/quality/duration/timing/severity/associated sxs/prior Treatment) The history is provided by the patient. No language interpreter was used.  Sylvia Green is a 19 y/o F with PMHx of STD exposure presenting to the ED with lower abdominal pain that has been ongoing since late 2015 with worsening over the past 2 months. Patient reported that the pain is localized to the lower portion of her abdomen described as a sharp, dull aching pain that is intermittent without a trend, that radiates up to her flanks bilaterally. Patient reported that she has been having a mild aching sensation in her lower back as well. Reported that she has been having mildly decreased appetite, but stated that she continues to eat without issues. Reported passing flatulence and making BMs daily without issues. Reported subjective fever yesterday-reported that she felt hot. Denied chills, diaphoresis, chest pain, shortness of breath, difficulty breathing, nausea, vomiting, diarrhea, melena, hematochezia, dizziness, decreased urine output, hematuria, dysuria, neck pain, neck stiffness, vaginal discharge, vaginal bleeding, cough, nasal congestion. PCP Dr. Casper Harrison  Past Medical History  Diagnosis Date  . Gonorrhea    Past Surgical History  Procedure Laterality Date  . No past surgeries     Family History  Problem Relation Age of Onset  . Hypertension Mother   . Depression Mother   . Anxiety disorder Mother   . Hyperlipidemia Mother    History  Substance Use Topics  . Smoking status: Never Smoker   . Smokeless tobacco: Never Used  . Alcohol Use: No   OB History    Gravida Para Term Preterm AB TAB SAB Ectopic Multiple Living   2 2 2       2      Review of Systems  Constitutional: Positive for fever  (subjective). Negative for chills.  HENT: Negative for congestion, sore throat and trouble swallowing.   Eyes: Negative for visual disturbance.  Respiratory: Negative for cough, chest tightness and shortness of breath.   Cardiovascular: Negative for chest pain.  Gastrointestinal: Positive for abdominal pain. Negative for nausea, vomiting, diarrhea, constipation, blood in stool and anal bleeding.  Genitourinary: Negative for dysuria, hematuria, vaginal bleeding, vaginal discharge and vaginal pain.  Musculoskeletal: Positive for back pain. Negative for neck pain and neck stiffness.  Neurological: Negative for dizziness, weakness and numbness.      Allergies  Review of patient's allergies indicates no known allergies.  Home Medications   Prior to Admission medications   Medication Sig Start Date End Date Taking? Authorizing Provider  doxycycline (VIBRAMYCIN) 100 MG capsule Take 1 capsule (100 mg total) by mouth 2 (two) times daily. 06/18/14  Yes Hannah Muthersbaugh, PA-C  ferrous sulfate 325 (65 FE) MG tablet Take 1 tablet (325 mg total) by mouth daily with breakfast. 10/03/13  Yes Stephanie Coup Street, MD  levonorgestrel (MIRENA) 20 MCG/24HR IUD 1 each by Intrauterine route once. Pt had it placed on 10/07/11   Yes Historical Provider, MD  ibuprofen (ADVIL,MOTRIN) 800 MG tablet Take 1 tablet (800 mg total) by mouth 3 (three) times daily. Patient not taking: Reported on 06/18/2014 02/25/14   Dahlia Client Muthersbaugh, PA-C  naproxen (NAPROSYN) 375 MG tablet Take 1 tablet (375 mg total) by mouth 2 (two) times daily. Patient not taking: Reported on 06/18/2014 04/18/14   Roxy Horseman, PA-C  triamcinolone cream (KENALOG) 0.1 % Apply 1 application topically 2 (two) times daily. Patient not taking: Reported on 06/18/2014 05/24/14   Twana First Hess, DO   BP 115/69 mmHg  Pulse 79  Temp(Src) 97.8 F (36.6 C) (Oral)  Resp 15  SpO2 100%  LMP 06/14/2014 Physical Exam  Constitutional: She is oriented to person,  place, and time. She appears well-developed and well-nourished. No distress.  HENT:  Head: Normocephalic and atraumatic.  Eyes: Conjunctivae and EOM are normal. Right eye exhibits no discharge. Left eye exhibits no discharge.  Neck: Normal range of motion. Neck supple.  Cardiovascular: Normal rate, regular rhythm and normal heart sounds.  Exam reveals no friction rub.   No murmur heard. Pulses:      Radial pulses are 2+ on the right side, and 2+ on the left side.       Dorsalis pedis pulses are 2+ on the right side, and 2+ on the left side.  Pulmonary/Chest: Effort normal and breath sounds normal. No respiratory distress. She has no wheezes. She has no rales.  Abdominal: Soft. Bowel sounds are normal. She exhibits no distension. There is tenderness in the suprapubic area. There is CVA tenderness (left ). There is no rebound and no guarding.  Genitourinary: There is no tenderness on the right labia. There is no tenderness on the left labia. Cervix exhibits discharge. Cervix exhibits no motion tenderness. No tenderness or bleeding in the vagina. No signs of injury around the vagina. Vaginal discharge found.  Two strings noted from IUD  Musculoskeletal: Normal range of motion.  Neurological: She is alert and oriented to person, place, and time. No cranial nerve deficit. She exhibits normal muscle tone. Coordination normal.  Skin: Skin is warm and dry. No rash noted. She is not diaphoretic. No erythema.  Psychiatric: She has a normal mood and affect. Her behavior is normal. Thought content normal.  Nursing note and vitals reviewed.   ED Course  Procedures (including critical care time)  Results for orders placed or performed during the hospital encounter of 06/21/14  CBC with Differential/Platelet  Result Value Ref Range   WBC 4.5 4.0 - 10.5 K/uL   RBC 4.37 3.87 - 5.11 MIL/uL   Hemoglobin 13.6 12.0 - 15.0 g/dL   HCT 16.1 09.6 - 04.5 %   MCV 89.7 78.0 - 100.0 fL   MCH 31.1 26.0 - 34.0 pg    MCHC 34.7 30.0 - 36.0 g/dL   RDW 40.9 81.1 - 91.4 %   Platelets 155 150 - 400 K/uL   Neutrophils Relative % 59 43 - 77 %   Neutro Abs 2.7 1.7 - 7.7 K/uL   Lymphocytes Relative 28 12 - 46 %   Lymphs Abs 1.3 0.7 - 4.0 K/uL   Monocytes Relative 8 3 - 12 %   Monocytes Absolute 0.3 0.1 - 1.0 K/uL   Eosinophils Relative 4 0 - 5 %   Eosinophils Absolute 0.2 0.0 - 0.7 K/uL   Basophils Relative 1 0 - 1 %   Basophils Absolute 0.1 0.0 - 0.1 K/uL  Comprehensive metabolic panel  Result Value Ref Range   Sodium 137 135 - 145 mmol/L   Potassium 3.5 3.5 - 5.1 mmol/L   Chloride 104 101 - 111 mmol/L   CO2 26 22 - 32 mmol/L   Glucose, Bld 91 65 - 99 mg/dL   BUN 12 6 - 20 mg/dL   Creatinine, Ser 7.82 0.44 - 1.00 mg/dL   Calcium 9.2 8.9 - 10.3  mg/dL   Total Protein 7.8 6.5 - 8.1 g/dL   Albumin 4.1 3.5 - 5.0 g/dL   AST 17 15 - 41 U/L   ALT 14 14 - 54 U/L   Alkaline Phosphatase 61 38 - 126 U/L   Total Bilirubin 0.4 0.3 - 1.2 mg/dL   GFR calc non Af Amer >60 >60 mL/min   GFR calc Af Amer >60 >60 mL/min   Anion gap 7 5 - 15  Urinalysis, Routine w reflex microscopic (not at [facility])  Result Value Ref Range   Color, Urine AMBER (A) YELLOW   APPearance CLOUDY (A) CLEAR   Specific Gravity, Urine 1.025 1.005 - 1.030   pH 6.0 5.0 - 8.0   Glucose, UA NEGATIVE NEGATIVE mg/dL   Hgb urine dipstick TRACE (A) NEGATIVE   Bilirubin Urine SMALL (A) NEGATIVE   Ketones, ur 15 (A) NEGATIVE mg/dL   Protein, ur NEGATIVE NEGATIVE mg/dL   Urobilinogen, UA 1.0 0.0 - 1.0 mg/dL   Nitrite NEGATIVE NEGATIVE   Leukocytes, UA SMALL (A) NEGATIVE  Lipase, blood  Result Value Ref Range   Lipase 28 22 - 51 U/L  Urine microscopic-add on  Result Value Ref Range   Squamous Epithelial / LPF MANY (A) RARE   WBC, UA 3-6 <3 WBC/hpf   RBC / HPF 3-6 <3 RBC/hpf   Bacteria, UA MANY (A) RARE   Urine-Other MUCOUS PRESENT   Pregnancy, urine  Result Value Ref Range   Preg Test, Ur NEGATIVE NEGATIVE    Labs Review Labs Reviewed   URINALYSIS, ROUTINE W REFLEX MICROSCOPIC (NOT AT [facility]) - Abnormal; Notable for the following:    Color, Urine AMBER (*)    APPearance CLOUDY (*)    Hgb urine dipstick TRACE (*)    Bilirubin Urine SMALL (*)    Ketones, ur 15 (*)    Leukocytes, UA SMALL (*)    All other components within normal limits  URINE MICROSCOPIC-ADD ON - Abnormal; Notable for the following:    Squamous Epithelial / LPF MANY (*)    Bacteria, UA MANY (*)    All other components within normal limits  CBC WITH DIFFERENTIAL/PLATELET  COMPREHENSIVE METABOLIC PANEL  LIPASE, BLOOD  PREGNANCY, URINE  POC URINE PREG, ED    Imaging Review US Transvaginal Non-ob  06/21/2014   CLINICAL DATA:  Abdominal pain. Bilateral pelvic pain for 4 months. Mirena IUD placed 2013.  EXAM: TRANSABDOMINAL AND TRANSVAGINAL ULTRASOUND OF PELVIS  DOPPLER ULTRASOUND OF OVARIES  TECHNIQUE: Both transabdominal and transvaginal ultrasound examinations of the pelvis were performed. Transabdominal technique was performed for global imaging of the pelvis including uterus, ovaries, adnexal regions, and pelvic cul-de-sac.  It was necessary to proceed with endovaginal exam following the transabdominal exam to visualize the ovaries. Color and duplex Doppler ultrasound was utilized to evaluate blood flow to the ovaries.  COMPARISON:  None.  FINDINGS: Uterus  Measurements: 7.7 x 4.0 x 4.3 cm. No fibroids or other mass visualized. The IUD is visualized and in good position.  Endometrium  Thickness: 4 mm.  No focal abnormality visualized.  Right ovary  Measurements: 3.5 x 1.8 x 1.6 cm, within normal limits. Normal appearance/no adnexal mass.  Left ovary  Measurements: 3.3 x 1.9 x 1.4 cm, within normal limits. Normal appearance/no adnexal mass.  Pulsed Doppler evaluation of both ovaries demonstrates normal low-resistance arterial and venous waveforms.  Other findings  No free fluid.  IMPRESSION: 1. Normal sonographic appearance of the uterus and ovaries. 2. No free  fluid.  3. Normal color Doppler flow and vascular waveforms within both ovaries.   Electronically Signed   By: Marin Roberts M.D.   On: 06/21/2014 13:34   US Pelvis Complete  06/21/2014   CLINICAL DATA:  Abdominal pain. Bilateral pelvic pain for 4 months. Mirena IUD placed 2013.  EXAM: TRANSABDOMINAL AND TRANSVAGINAL ULTRASOUND OF PELVIS  DOPPLER ULTRASOUND OF OVARIES  TECHNIQUE: Both transabdominal and transvaginal ultrasound examinations of the pelvis were performed. Transabdominal technique was performed for global imaging of the pelvis including uterus, ovaries, adnexal regions, and pelvic cul-de-sac.  It was necessary to proceed with endovaginal exam following the transabdominal exam to visualize the ovaries. Color and duplex Doppler ultrasound was utilized to evaluate blood flow to the ovaries.  COMPARISON:  None.  FINDINGS: Uterus  Measurements: 7.7 x 4.0 x 4.3 cm. No fibroids or other mass visualized. The IUD is visualized and in good position.  Endometrium  Thickness: 4 mm.  No focal abnormality visualized.  Right ovary  Measurements: 3.5 x 1.8 x 1.6 cm, within normal limits. Normal appearance/no adnexal mass.  Left ovary  Measurements: 3.3 x 1.9 x 1.4 cm, within normal limits. Normal appearance/no adnexal mass.  Pulsed Doppler evaluation of both ovaries demonstrates normal low-resistance arterial and venous waveforms.  Other findings  No free fluid.  IMPRESSION: 1. Normal sonographic appearance of the uterus and ovaries. 2. No free fluid. 3. Normal color Doppler flow and vascular waveforms within both ovaries.   Electronically Signed   By: Marin Roberts M.D.   On: 06/21/2014 13:34   Ct Abdomen Pelvis W Contrast  06/21/2014   CLINICAL DATA:  Lower abdominal pain radiating to both flanks, with progression over recent several months.  EXAM: CT ABDOMEN AND PELVIS WITH CONTRAST  TECHNIQUE: Multidetector CT imaging of the abdomen and pelvis was performed using the standard protocol following  bolus administration of intravenous contrast.  CONTRAST:  80mL OMNIPAQUE IOHEXOL 300 MG/ML  SOLN  COMPARISON:  None.  FINDINGS: Lower Chest:  Unremarkable.  Hepatobiliary: No masses or other significant abnormality identified. Gallbladder is unremarkable.  Pancreas: No mass, inflammatory changes, or other significant abnormality identified.  Spleen:  Within normal limits in size and appearance.  Adrenals:  No masses identified.  Kidneys/Urinary Tract:  No evidence of masses or hydronephrosis.  Stomach/Bowel/Peritoneum: No evidence of wall thickening, mass, or obstruction.  Vascular/Lymphatic: No pathologically enlarged lymph nodes identified. No other significant abnormality visualized.  Reproductive: IUD is malpositioned upside down within the endometrial cavity of the uterus. Adnexal regions are unremarkable.  Other:  None.  Musculoskeletal:  No suspicious bone lesions identified.  IMPRESSION: Malpositioned IUD which is up side down within the endometrial cavity.  No other significant abnormality identified within the abdomen or pelvis.   Electronically Signed   By: Myles Rosenthal M.D.   On: 06/21/2014 15:24   US Renal  06/21/2014   CLINICAL DATA:  Bilateral flank pain for 4 months.  EXAM: RENAL / URINARY TRACT ULTRASOUND COMPLETE  COMPARISON:  None.  FINDINGS: Right Kidney:  Length: 11.8 cm. Echogenicity within normal limits. No mass or hydronephrosis visualized.  Left Kidney:  Length: 11.8 cm. Echogenicity within normal limits. No mass or hydronephrosis visualized.  Bladder:  Appears normal for degree of bladder distention.  IMPRESSION: Unremarkable renal ultrasound.   Electronically Signed   By: Charlett Nose M.D.   On: 06/21/2014 13:24   [place] Art/ven Flow Abd Pelv Doppler  06/21/2014   CLINICAL DATA:  Abdominal pain. Bilateral pelvic pain for 4  months. Mirena IUD placed 2013.  EXAM: TRANSABDOMINAL AND TRANSVAGINAL ULTRASOUND OF PELVIS  DOPPLER ULTRASOUND OF OVARIES  TECHNIQUE: Both transabdominal and  transvaginal ultrasound examinations of the pelvis were performed. Transabdominal technique was performed for global imaging of the pelvis including uterus, ovaries, adnexal regions, and pelvic cul-de-sac.  It was necessary to proceed with endovaginal exam following the transabdominal exam to visualize the ovaries. Color and duplex Doppler ultrasound was utilized to evaluate blood flow to the ovaries.  COMPARISON:  None.  FINDINGS: Uterus  Measurements: 7.7 x 4.0 x 4.3 cm. No fibroids or other mass visualized. The IUD is visualized and in good position.  Endometrium  Thickness: 4 mm.  No focal abnormality visualized.  Right ovary  Measurements: 3.5 x 1.8 x 1.6 cm, within normal limits. Normal appearance/no adnexal mass.  Left ovary  Measurements: 3.3 x 1.9 x 1.4 cm, within normal limits. Normal appearance/no adnexal mass.  Pulsed Doppler evaluation of both ovaries demonstrates normal low-resistance arterial and venous waveforms.  Other findings  No free fluid.  IMPRESSION: 1. Normal sonographic appearance of the uterus and ovaries. 2. No free fluid. 3. Normal color Doppler flow and vascular waveforms within both ovaries.   Electronically Signed   By: Marin Robertshristopher  Mattern M.D.   On: 06/21/2014 13:34     EKG Interpretation None       3:56 PM Dr. Rubin PayorPickering reviewed patient's urine culture and GC/chlamydia probe. Reported to keep patient on Doxycycline for this can treat for the chlamydia and the UTI.    4:14 PM This provider spoke with Dr. Adrian BlackwaterStinson, [facility], regarding imaging results. Imaging reviewed physician. Physician recommended to check for strings and stated that if string are intact or not intact patient can follow up as an outpatient early in the week to be assessed. Reported that patient can be seen early in the week.   MDM   Final diagnoses:  Abdominal pain  Chlamydia infection  PID (acute pelvic inflammatory disease)  IUD check up    Medications  sodium chloride 0.9 %  bolus 1,000 mL (0 mLs Intravenous Stopped 06/21/14 1127)  iohexol (OMNIPAQUE) 300 MG/ML solution 50 mL (50 mLs Oral Contrast Given 06/21/14 1414)  iohexol (OMNIPAQUE) 300 MG/ML solution 100 mL (80 mLs Intravenous Contrast Given 06/21/14 1504)    Filed Vitals:   06/21/14 0910 06/21/14 1114 06/21/14 1333 06/21/14 1535  BP: 109/68 110/76 108/58 115/69  Pulse: 100 80 82 79  Temp: 98.1 F (36.7 C) 97.8 F (36.6 C)    TempSrc: Oral Oral    Resp: 17 15 14 15   SpO2: 99% 100% 100% 100%   This provider reviewed the patient's chart. Patient was seen and assessed in ED setting on 06/18/2014 where CBC, CMP were unremarkable. Wet prep noted moderate clue cells, few white blood cells. Urinalysis was performed with urine culture being sent with greater than 100,000 cultures of Escherichia coli. GC Chlamydia probe came back positive for chlamydia. Patient was discharged home with doxycycline. CBC negative elevated leukocytosis. Hemoglobin 13.6, hematocrit 39.2. CMP unremarkable. Lipase negative elevation. Urinalysis noted trace of hemoglobin with negative nitrites, small leukocytes with positive ketones of 15. White blood cell count of 3-6 and the blood cell count of 3-6. Urine pregnancy negative. Renal ultrasound unremarkable. Pelvic ultrasound noted normal sonographic appearance of the uterus and ovaries, no free fluid. Negative findings of ovarian torsion. CT abdomen and pelvis with contrast negative for acute abdominal abnormalities - IUD noted to be malpositioned within the endometrial cavity.  But, on [place] IUD appears to be in good position.  This provider spoke with OBGYN physician, Dr. Adrian Blackwater, recommended to perform pelvic to see if strings are in place - reported that nothing acute needs to be done, reported that the pain is mainly from her Chlamydial infection. Reported that patient can follow up as an outpatient in the OBGYN clinic. Two strings noted on pelvic exam.   Doubt TOA. Doubt ovarian torsion. Doubt  acute abdominal processes. CT noted UID malposition, but [place] noted good position - OBGYN consulted - two strings noted on examination. Patient currently being treated for chlamydia and received treatment from previous visit. Patient stable, afebrile. Patient not septic appearing. Negative signs of respiratory distress. Patient discharged. Discussed with patient to follow up with OBGYN regarding IUD beginning of the week, OBGYN aware - patient understood. Discussed with patient to continue to take antibiotics as prescribed. Discussed with patient to have partner tested and for treatment. Educated patient on STDs and protection. Discussed with patient to closely monitor symptoms and if symptoms are to worsen or change to report back to the ED - strict return instructions given.  Patient agreed to plan of care, understood, all questions answered.   Raymon Mutton, PA-C 06/21/14 1700  Benjiman Core, MD 06/24/14 1450

## 2014-06-21 NOTE — Discharge Instructions (Signed)
Please call your doctor for a followup appointment within 24-48 hours. When you talk to your doctor please let them know that you were seen in the emergency department and have them acquire all of your records so that they can discuss the findings with you and formulate a treatment plan to fully care for your new and ongoing problems. Please call and set-up an appointment with OBGYN to be re-assessed early in the week regarding malpositioned IUD Please call and set-up an appointment with your primary care provider Please have all partner(s) within the past 3-6 months to be assessed and tested for STDs Please continue to take doxycycline as prescribed  Please rest and stay hydrated Please avoid any sexually activity until fully treated and tests are negative  - please always use protection  Please continue to monitor symptoms closely and if symptoms are to worsen or change (fever greater than 101, chills, sweating, nausea, vomiting, chest pain, shortness of breathe, difficulty breathing, weakness, numbness, tingling, worsening or changes to pain pattern, vaginal bleeding, increased vaginal discharge, back pain, inability to keep food or fluids down) please report back to the Emergency Department immediately.   Sexually Transmitted Disease A sexually transmitted disease (STD) is a disease or infection that may be passed (transmitted) from person to person, usually during sexual activity. This may happen by way of saliva, semen, blood, vaginal mucus, or urine. Common STDs include:   Gonorrhea.   Chlamydia.   Syphilis.   HIV and AIDS.   Genital herpes.   Hepatitis B and C.   Trichomonas.   Human papillomavirus (HPV).   Pubic lice.   Scabies.  Mites.  Bacterial vaginosis. WHAT ARE CAUSES OF STDs? An STD may be caused by bacteria, a virus, or parasites. STDs are often transmitted during sexual activity if one person is infected. However, they may also be transmitted through  nonsexual means. STDs may be transmitted after:   Sexual intercourse with an infected person.   Sharing sex toys with an infected person.   Sharing needles with an infected person or using unclean piercing or tattoo needles.  Having intimate contact with the genitals, mouth, or rectal areas of an infected person.   Exposure to infected fluids during birth. WHAT ARE THE SIGNS AND SYMPTOMS OF STDs? Different STDs have different symptoms. Some people may not have any symptoms. If symptoms are present, they may include:   Painful or bloody urination.   Pain in the pelvis, abdomen, vagina, anus, throat, or eyes.   A skin rash, itching, or irritation.  Growths, ulcerations, blisters, or sores in the genital and anal areas.  Abnormal vaginal discharge with or without bad odor.   Penile discharge in men.   Fever.   Pain or bleeding during sexual intercourse.   Swollen glands in the groin area.   Yellow skin and eyes (jaundice). This is seen with hepatitis.   Swollen testicles.  Infertility.  Sores and blisters in the mouth. HOW ARE STDs DIAGNOSED? To make a diagnosis, your health care provider may:   Take a medical history.   Perform a physical exam.   Take a sample of any discharge to examine.  Swab the throat, cervix, opening to the penis, rectum, or vagina for testing.  Test a sample of your first morning urine.   Perform blood tests.   Perform a Pap test, if this applies.   Perform a colposcopy.   Perform a laparoscopy.  HOW ARE STDs TREATED? Treatment depends on the STD. Some  STDs may be treated but not cured.   Chlamydia, gonorrhea, trichomonas, and syphilis can be cured with antibiotic medicine.   Genital herpes, hepatitis, and HIV can be treated, but not cured, with prescribed medicines. The medicines lessen symptoms.   Genital warts from HPV can be treated with medicine or by freezing, burning (electrocautery), or surgery. Warts  may come back.   HPV cannot be cured with medicine or surgery. However, abnormal areas may be removed from the cervix, vagina, or vulva.   If your diagnosis is confirmed, your recent sexual partners need treatment. This is true even if they are symptom-free or have a negative culture or evaluation. They should not have sex until their health care providers say it is okay. HOW CAN I REDUCE MY RISK OF GETTING AN STD? Take these steps to reduce your risk of getting an STD:  Use latex condoms, dental dams, and water-soluble lubricants during sexual activity. Do not use petroleum jelly or oils.  Avoid having multiple sex partners.  Do not have sex with someone who has other sex partners.  Do not have sex with anyone you do not know or who is at high risk for an STD.  Avoid risky sex practices that can break your skin.  Do not have sex if you have open sores on your mouth or skin.  Avoid drinking too much alcohol or taking illegal drugs. Alcohol and drugs can affect your judgment and put you in a vulnerable position.  Avoid engaging in oral and anal sex acts.  Get vaccinated for HPV and hepatitis. If you have not received these vaccines in the past, talk to your health care provider about whether one or both might be right for you.   If you are at risk of being infected with HIV, it is recommended that you take a prescription medicine daily to prevent HIV infection. This is called pre-exposure prophylaxis (PrEP). You are considered at risk if:  You are a man who has sex with other men (MSM).  You are a heterosexual man or woman and are sexually active with more than one partner.  You take drugs by injection.  You are sexually active with a partner who has HIV.  Talk with your health care provider about whether you are at high risk of being infected with HIV. If you choose to begin PrEP, you should first be tested for HIV. You should then be tested every 3 months for as long as you  are taking PrEP.  WHAT SHOULD I DO IF I THINK I HAVE AN STD?  See your health care provider.   Tell your sexual partner(s). They should be tested and treated for any STDs.  Do not have sex until your health care provider says it is okay. WHEN SHOULD I GET IMMEDIATE MEDICAL CARE? Contact your health care provider right away if:   You have severe abdominal pain.  You are a man and notice swelling or pain in your testicles.  You are a woman and notice swelling or pain in your vagina. Document Released: 03/27/2002 Document Revised: 01/09/2013 Document Reviewed: 07/25/2012 [facility] Patient Information 2015 [place], [place]. This information is not intended to replace advice given to you by your health care provider. Make sure you discuss any questions you have with your health care provider.   Emergency Department Resource Guide 1) Find a Doctor and Pay Out of Pocket Although you won't have to find out who is covered by your insurance plan, it is a  good idea to ask around and get recommendations. You will then need to call the office and see if the doctor you have chosen will accept you as a new patient and what types of options they offer for patients who are self-pay. Some doctors offer discounts or will set up payment plans for their patients who do not have insurance, but you will need to ask so you aren't surprised when you get to your appointment.  2) Contact Your Local Health Department Not all health departments have doctors that can see patients for sick visits, but many do, so it is worth a call to see if yours does. If you don't know where your local health department is, you can check in your phone book. The CDC also has a tool to help you locate your state's health department, and many state websites also have listings of all of their local health departments.  3) Find a Walk-in Clinic If your illness is not likely to be very severe or complicated, you may want to try a walk in  clinic. These are popping up all over the country in pharmacies, drugstores, and shopping centers. They're usually staffed by nurse practitioners or physician assistants that have been trained to treat common illnesses and complaints. They're usually fairly quick and inexpensive. However, if you have serious medical issues or chronic medical problems, these are probably not your best option.  No Primary Care Doctor: - Call Health Connect at  [phone] - they can help you locate a primary care doctor that  accepts your insurance, provides certain services, etc. - Physician Referral Service- [phone]  Chronic Pain Problems: Organization         Address  Phone   Notes  [facility]  [phone] Patients need to be referred by their primary care doctor.   Medication Assistance: Organization         Address  Phone   Notes  [facility] Med[facility] [address] [place] [place], [place] [address] [phone] --Must be a resident of [facility] -- Must have NO insurance coverage whatsoever (no Medicaid/ Medicare, etc.) -- The pt. MUST have a primary care doctor that directs their care regularly and follows them in the community   [facility]  [phone]   [employer]  [phone]    Agencies that provide inexpensive medical care: Organization         Address  Phone   Notes  [facility]  [phone]   [facility]    [phone]   [facility] [address] [place], [place] [address] [phone]   [facility] [address] [place] [place] [phone]   Planned Parenthood    [phone]   [facility]    [phone]   [facility]  [address], [place] Phone:  [phone], Fax:  [phone] Hours of Operation:  9 am - 6 pm, M-F.  Also accepts Medicaid/Medicare and self-pay.  [facility]  [address],  Phone: [phone], Fax: [phone]. Hours of Operation:  8:30 am - 5:30 pm, M-F.  Also accepts Medicaid and self-pay.  [facility] [place] [address], [place] Phone: [phone]   [facility] [address] Natasha Bence [place], [place] [phone], Ext. [phone] Mondays & Thursdays: 7-9 AM.  First  15 patients are seen on a first come, first serve basis.    Medicaid-accepting [facility] Providers:  Organization         Address  Phone   Notes  [facility] [address], [place] [phone] Also accepts self-pay patients.  [facility] [address] Laurell Josephs [place], [place]  [phone]   [facility] [address], [place] [phone]   [facility] [address], [place] [phone]   Renaye Rakers [address], [place]   [phone] Only accepts [place] Access [place] patients after they have their name applied to their card.   Self-Pay (no insurance) in [facility]:  Organization         Address  Phone   Notes  Sickle Cell Patients, [facility] [address] [place], [place] [phone]   [facility] [address] [place], [place] [phone]   [facility]  [address], [place] [phone]   [facility]/Dr. Osei-Bonsu  [address] [place] or [address], [place] [phone] Phone number for both [place] and [place] locations is the same.  [facility] [address] [place] [phone]   [facility] [address] [place] or [address] [phone]   [facility] [address] [place] [phone], phone; [phone], fax Sees patients  1st and 3rd Saturday of every month.  Must not qualify for public or private insurance (i.e. Medicaid, Medicare, [place] Health Choice, Veterans' Benefits)  Household income should be no more than 200% of the poverty level The clinic cannot treat you if you are pregnant or think you are pregnant  Sexually transmitted diseases are not treated at the clinic.    Dental Care: Organization         Address  Phone  Notes  [facility] Department of [facility] [address] [place], [place] [phone] Accepts children up to age 63 who are enrolled in [place] or [place] Health Choice; pregnant women with a Medicaid card; and children who have applied for Medicaid or [place] Health Choice, but were declined, whose parents can pay a reduced fee at time of service.  [facility] Department of [facility]  [address], [place] [phone] Accepts children up to age 48 who are enrolled in [place] or [place] Health Choice; pregnant women with a Medicaid card; and children who have applied for Medicaid or [place] Health Choice, but were declined, whose parents can pay a reduced fee at time of service.  [facility]  [address] [place], [place] [phone] Patients are seen by appointment only. Walk-ins are not accepted. [facility] will see patients 42 years of age and older. Monday - Tuesday (8am-5pm) Most Wednesdays (8:30-5pm) $30 per visit, cash only  [facility] Adult Dental Access PROGRAM  [address], [facility] [phone] Patients are seen by appointment only. Walk-ins are not accepted. [facility] will see patients 37 years of age and older. One Wednesday Evening (Monthly: Volunteer Based).  $30 per visit, cash only  [employer]  [phone] for adults; Children under age 38, call Graduate Pediatric Dentistry  at [phone]. Children aged 27-14, please call [phone] to request a  pediatric application.  Dental services are provided in all areas of dental care including fillings, crowns and bridges, complete and partial dentures, implants, gum treatment, root canals, and extractions. Preventive care is also provided. Treatment is provided to both adults and children. Patients are selected via a lottery and there is often a waiting list.   [facility] [address], [place]  [phone] [email or web address]   [facility] [address] [place], [place] [phone], Ext. [phone] Second and Fourth Thursday of each month, opens at 6:30 AM; Clinic ends at 9 AM.  Patients are seen on a first-come first-served basis, and a limited number are seen during each clinic.   [facility]  [address] Ether Griffins [place], [place] [phone]   Eligibility Requirements You must have lived in [place], [place], or [place] counties for at least the last three months.   You cannot be eligible for state or federal sponsored [place], including [employer], [place], or [employer].   You generally cannot be eligible for healthcare insurance through your employer.    How to apply: Eligibility screenings are held every Tuesday and Wednesday afternoon from 1:00 pm until 4:00 pm. You do not need an appointment for the interview!  [facility] [address], [place], [place] [phone]   [facility] Health Department  [phone]   [facility] Health [facility]  [phone]   [facility] Health Department  [phone]    Behavioral Health Resources in the Community: Intensive Outpatient Programs Organization         Address  Phone  Notes  [facility] Services [address], [place], [place] [phone]   [facility] Outpatient [address] [place], [place] [phone]   [facility] [address], [place], [place]  [phone]   [facility]  Mental Health [address],  [place], [place] [phone] or [phone]   Substance Abuse Resources Organization         Address  Phone  Notes  [facility]  [phone]   [facility]  [phone]   [facility] [place]  [phone]   Floydene Flock  [phone]   Residential & Outpatient Substance Abuse Program  [phone]   Psychological Services Organization         Address  Phone  Notes  [facility] Behavioral Health  [phone]   [facility] Services  [phone]   [facility] Mental Health [address], [place] [facility] [facility] [phone] or [phone]    Mobile Crisis Teams Organization         Address  Phone  Notes  [facility], Mobile Crisis Care Unit  [phone]   [facility]  [address] [place], [place] [phone]   Doristine Locks [address] [place] [phone]    Self-Help/Support Groups Organization         Address  Phone             Notes  [facility] [place] - variety of support groups  336- I7437963 Call for more information  Narcotics Anonymous (NA), [facility] [address], [employer] [place]  2 meetings at this location   Chief Executive Officer  Notes  [facility] [address],    [place]  [place]  [phone]   [facility]  [address] [place] [address], [place], [place] [phone]   [facility] [address] [place], [place] [phone] Admissions: 8am-3pm M-F  [facility] [address],    [place], [place] [phone]   [facility] [address] [place], [place], [place] [phone]   [facility] [address],  [place], [place] [phone]   [facility] - Intensive Outpatient [address] Laurell Josephs [address], [place], [place] [phone]   [facility] ([facility]iction [facility] [address] [place]    [place], [place] [phone] or [phone]   [facility] ([facility]) [address], [place], [place] [phone] Accepts Medicaid  [facility] [address]  [place] [phone] Substance Abuse/Addiction Treatment   [facility] Organization         Address  Phone  Notes  [facility]  5180932692   Angie Fava, PhD [address] Ervin Knack [place], [place]   [phone] or [phone]   [facility]   [address] [place], [place] [phone]   [facility] [address], [place], [place] [phone] Insurance/Medicaid/sponsorship through [facility] and Families [address]                                    [place], [place] [phone] Therapy/tele-psych/case  [facility] [address] [place], [place] [phone]    Dr. Lolly Mustache  [phone]   [facility]  [employer] [facility] Dept. 1) [address] [place] 2) [address] [place] 3)  [address], [place] [phone]  [phone]   [facility] Child Abuse Hotline [phone] or 9496025566 (After Hours)

## 2014-06-22 NOTE — Progress Notes (Signed)
ED Antimicrobial Stewardship Positive Culture Follow Up   Sylvia Green is an 19 y.o. female who [facility] with a chief complaint of  Chief Complaint  Patient presents with  . Abdominal Pain    Recent Results (from the past 720 hour(s))  Urine culture     Status: None   Collection Time: 06/18/14  4:36 PM  Result Value Ref Range Status   Specimen Description URINE, CLEAN CATCH  Final   Special Requests NONE  Final   Colony Count   Final    >=100,000 COLONIES/ML [facility]    Culture   Final    ESCHERICHIA COLI [facility]formed at [facility]    Report Status 06/21/2014 FINAL  Final   Organism ID, Bacteria ESCHERICHIA COLI  Final      Susceptibility   Escherichia coli - MIC*    AMPICILLIN <=2 SENSITIVE Sensitive     CEFAZOLIN <=4 SENSITIVE Sensitive     CEFTRIAXONE <=1 SENSITIVE Sensitive     CIPROFLOXACIN <=0.25 SENSITIVE Sensitive     GENTAMICIN <=1 SENSITIVE Sensitive     LEVOFLOXACIN <=0.12 SENSITIVE Sensitive     NITROFURANTOIN <=16 SENSITIVE Sensitive     TOBRAMYCIN <=1 SENSITIVE Sensitive     TRIMETH/SULFA <=20 SENSITIVE Sensitive     PIP/TAZO <=4 SENSITIVE Sensitive     * ESCHERICHIA COLI  Wet prep, genital     Status: Abnormal   Collection Time: 06/18/14  5:45 PM  Result Value Ref Range Status   Yeast Wet Prep HPF POC NONE SEEN NONE SEEN Final   Trich, Wet Prep NONE SEEN NONE SEEN Final   Clue Cells Wet Prep HPF POC MODERATE (A) NONE SEEN Final   WBC, Wet Prep HPF POC FEW (A) NONE SEEN Final    [x]  Treated with Doxycycline, organism resistant to prescribed antimicrobial  The doxycycline is covering her Chlamydia.  However it is not reliable for E. Coli.  New antibiotic prescription: Cephalexin 500mg  PO TID x 5 days.  Patient should continue her Doxycycline to treat Chlamydia and possible PID.  ED Provider: Rhea BleacherJosh Geiple, PA-C   Sallee Provencalurner, Genell Thede S 06/22/2014, 9:53 AM Infectious Diseases Pharmacist Phone#  [phone]

## 2014-06-23 ENCOUNTER — Telehealth: Payer: Self-pay | Admitting: Emergency Medicine

## 2014-06-23 NOTE — Telephone Encounter (Signed)
Attempt to contact patient by phone regarding positive Urine culture and postiive Chlamyida. Phone number provided does not accept incoming called. Letter sent.

## 2014-06-24 ENCOUNTER — Encounter: Payer: Medicaid Other | Admitting: Obstetrics and Gynecology

## 2014-06-26 ENCOUNTER — Telehealth (HOSPITAL_BASED_OUTPATIENT_CLINIC_OR_DEPARTMENT_OTHER): Payer: Self-pay | Admitting: Emergency Medicine

## 2014-06-26 ENCOUNTER — Ambulatory Visit (INDEPENDENT_AMBULATORY_CARE_PROVIDER_SITE_OTHER): Payer: Medicaid Other | Admitting: Obstetrics & Gynecology

## 2014-06-26 ENCOUNTER — Encounter: Payer: Self-pay | Admitting: Obstetrics & Gynecology

## 2014-06-26 VITALS — BP 108/81 | HR 77 | Temp 98.3°F | Ht 68.0 in | Wt 138.5 lb

## 2014-06-26 DIAGNOSIS — A749 Chlamydial infection, unspecified: Secondary | ICD-10-CM | POA: Diagnosis not present

## 2014-06-26 DIAGNOSIS — Z30432 Encounter for removal of intrauterine contraceptive device: Secondary | ICD-10-CM | POA: Diagnosis not present

## 2014-06-26 DIAGNOSIS — Z7189 Other specified counseling: Secondary | ICD-10-CM

## 2014-06-26 DIAGNOSIS — Z3042 Encounter for surveillance of injectable contraceptive: Secondary | ICD-10-CM

## 2014-06-26 DIAGNOSIS — Z7185 Encounter for immunization safety counseling: Secondary | ICD-10-CM

## 2014-06-26 DIAGNOSIS — N739 Female pelvic inflammatory disease, unspecified: Secondary | ICD-10-CM | POA: Diagnosis not present

## 2014-06-26 DIAGNOSIS — Z23 Encounter for immunization: Secondary | ICD-10-CM

## 2014-06-26 HISTORY — DX: Chlamydial infection, unspecified: A74.9

## 2014-06-26 MED ORDER — MEDROXYPROGESTERONE ACETATE 150 MG/ML IM SUSP
150.0000 mg | INTRAMUSCULAR | Status: DC
  Administered 2014-06-26: 150 mg via INTRAMUSCULAR

## 2014-06-26 NOTE — Patient Instructions (Signed)
HPV Vaccine Gardasil (Human Papillomavirus): What You Need to Know 1. What is HPV? Genital human papillomavirus (HPV) is the most common sexually transmitted virus in the [place]. More than half of sexually active men and women are infected with HPV at some time in their lives. About 20 million Americans are currently infected, and about 6 million more get infected each year. HPV is usually spread through sexual contact. Most HPV infections don't cause any symptoms, and go away on their own. But HPV can cause cervical cancer in women. Cervical cancer is the 2nd leading cause of cancer deaths among women around the world. In the [place], about 12,000 women get cervical cancer every year and about 4,000 are expected to die from it. HPV is also associated with several less common cancers, such as vaginal and vulvar cancers in women, and anal and oropharyngeal (back of the throat, including base of tongue and tonsils) cancers in both men and women. HPV can also cause genital warts and warts in the throat. There is no cure for HPV infection, but some of the problems it causes can be treated. 2. HPV vaccine: Why get vaccinated? The HPV vaccine you are getting is one of two vaccines that can be given to prevent HPV. It may be given to both males and females.  This vaccine can prevent most cases of cervical cancer in females, if it is given before exposure to the virus. In addition, it can prevent vaginal and vulvar cancer in females, and genital warts and anal cancer in both males and females. Protection from HPV vaccine is expected to be long-lasting. But vaccination is not a substitute for cervical cancer screening. Women should still get regular Pap tests. 3. Who should get this HPV vaccine and when? HPV vaccine is given as a 3-dose series  1st Dose: Now  2nd Dose: 1 to 2 months after Dose 1  3rd Dose: 6 months after Dose 1 Additional (booster) doses are not recommended. Routine  vaccination  This HPV vaccine is recommended for girls and boys 11 or 21 years of age. It may be given starting at age 9. Why is HPV vaccine recommended at 11 or 21 years of age?  HPV infection is easily acquired, even with only one sex partner. That is why it is important to get HPV vaccine before any sexual contact takes place. Also, response to the vaccine is better at this age than at older ages. Catch-up vaccination This vaccine is recommended for the following people who have not completed the 3-dose series:   Females 13 through 21 years of age.  Males 13 through 21 years of age. This vaccine may be given to men 22 through 21 years of age who have not completed the 3-dose series. It is recommended for men through age 26 who have sex with men or whose immune system is weakened because of HIV infection, other illness, or medications.  HPV vaccine may be given at the same time as other vaccines. 4. Some people should not get HPV vaccine or should wait.  Anyone who has ever had a life-threatening allergic reaction to any component of HPV vaccine, or to a previous dose of HPV vaccine, should not get the vaccine. Tell your doctor if the person getting vaccinated has any severe allergies, including an allergy to yeast.  HPV vaccine is not recommended for pregnant women. However, receiving HPV vaccine when pregnant is not a reason to consider terminating the pregnancy. Women who are breast   feeding may get the vaccine.  People who are mildly ill when a dose of HPV is planned can still be vaccinated. People with a moderate or severe illness should wait until they are better. 5. What are the risks from this vaccine? This HPV vaccine has been used in the [place] and around the world for about six years and has been very safe. However, any medicine could possibly cause a serious problem, such as a severe allergic reaction. The risk of any vaccine causing a serious injury, or death, is extremely  small. Life-threatening allergic reactions from vaccines are very rare. If they do occur, it would be within a few minutes to a few hours after the vaccination. Several mild to moderate problems are known to occur with this HPV vaccine. These do not last long and go away on their own.  Reactions in the arm where the shot was given:  Pain (about 8 people in 10)  Redness or swelling (about 1 person in 4)  Fever:  Mild (100 F) (about 1 person in 10)  Moderate (102 F) (about 1 person in 65)  Other problems:  Headache (about 1 person in 3)  Fainting: Brief fainting spells and related symptoms (such as jerking movements) can happen after any medical procedure, including vaccination. Sitting or lying down for about 15 minutes after a vaccination can help prevent fainting and injuries caused by falls. Tell your doctor if the patient feels dizzy or light-headed, or has vision changes or ringing in the ears.  Like all vaccines, HPV vaccines will continue to be monitored for unusual or severe problems. 6. What if there is a serious reaction? What should I look for?  Look for anything that concerns you, such as signs of a severe allergic reaction, very high fever, or behavior changes. Signs of a severe allergic reaction can include hives, swelling of the face and throat, difficulty breathing, a fast heartbeat, dizziness, and weakness. These would start a few minutes to a few hours after the vaccination.  What should I do?  If you think it is a severe allergic reaction or other emergency that can't wait, call 9-1-1 or get the person to the nearest hospital. Otherwise, call your doctor.  Afterward, the reaction should be reported to the Vaccine Adverse Event Reporting System (VAERS). Your doctor might file this report, or you can do it yourself through the VAERS web site at www.vaers.hhs.gov, or by calling 1-800-822-7967. VAERS is only for reporting reactions. They do not give medical  advice. 7. The National Vaccine Injury Compensation Program  The National Vaccine Injury Compensation Program (VICP) is a federal program that was created to compensate people who may have been injured by certain vaccines.  Persons who believe they may have been injured by a vaccine can learn about the program and about filing a claim by calling 1-800-338-2382 or visiting the VICP website at www.hrsa.gov/vaccinecompensation. 8. How can I learn more?  Ask your doctor.  Call your local or state health department.  Contact the Centers for Disease Control and Prevention (CDC):  Call 1-800-232-4636 (1-800-CDC-INFO)  or  Visit CDC's website at www.cdc.gov/vaccines CDC Human Papillomavirus (HPV) Gardasil (Interim) 06/04/11 Document Released: 11/01/2005 Document Revised: 05/21/2013 Document Reviewed: 02/15/2013 ExitCare Patient Information 2015 [employer]. This information is not intended to replace advice given to you by your health care provider. Make sure you discuss any questions you have with your health care provider.  

## 2014-06-26 NOTE — Telephone Encounter (Signed)
Notified of + Chlamydia with treatment and need for cephalexin 500mg  po tid x 5 days for UTI, [facility] [address] [phone]

## 2014-06-26 NOTE — Progress Notes (Signed)
CLINIC ENCOUNTER NOTE  History:  19 y.o. [id number] here today for follow up after recent treatment for PID and Chlamydia after ED encounter on 06/21/2014.  Pain is improved on analgesics and antibiotics. Denies fever, abnormal bleeding or other concerns.  Patient desires IUD removal. She feels this has caused her pain for three years.  Desires to start Depo Provera today.  Past Medical History  Diagnosis Date  . Gonorrhea   . Chlamydia infection 06/26/2014    Past Surgical History  Procedure Laterality Date  . No past surgeries      The following portions of the patient's history were reviewed and updated as appropriate: allergies, current medications, past family history, past medical history, past social history, past surgical history and problem list.   Health Maintenance:  Has not received HPV vaccine series.  Review of Systems:  Pertinent items are noted in HPI. Comprehensive review of systems was otherwise negative.  Objective:  Physical Exam BP 108/81 mmHg  Pulse 77  Temp(Src) 98.3 F (36.8 C)  Ht  (1.727 m)  Wt 138 lb 8 oz (62.823 kg)  BMI 21.06 kg/m2  LMP 06/14/2014 CONSTITUTIONAL: Well-developed, well-nourished female in no acute distress.  HENT:  Normocephalic, atraumatic, External right and left ear normal. Oropharynx is clear and moist EYES: Conjunctivae and EOM are normal. Pupils are equal, round, and reactive to light. No scleral icterus.  NECK: Normal range of motion, supple, no masses SKIN: Skin is warm and dry. No rash noted. Not diaphoretic. No erythema. No pallor. NEUROLGIC: Alert and oriented to person, place, and time. Normal reflexes, muscle tone coordination. No cranial nerve deficit noted. PSYCHIATRIC: Normal mood and affect. Normal behavior. Normal judgment and thought content. CARDIOVASCULAR: Normal heart rate noted RESPIRATORY: Effort and breath sounds normal, no problems with respiration noted ABDOMEN: Soft, no distention noted.  No  tenderness, rebound or guarding.  PELVIC: Normal appearing external genitalia; normal appearing vaginal mucosa and cervix.  IUD strings visualized.  No abnormal discharge noted.  Normal uterine size, no other palpable masses, no uterine or adnexal tenderness. MUSCULOSKELETAL: Normal range of motion. No edema and no tenderness.   IUD Removal  Patient identified, informed consent performed, consent signed.  During the pelvic exam, the strings of the IUD were visualized, grasped and pulled using ring forceps. The IUD was removed in its entirety.  Patient tolerated the procedure well.    Labs and Imaging US Transvaginal Non-ob  06/21/2014   CLINICAL DATA:  Abdominal pain. Bilateral pelvic pain for 4 months. Mirena IUD placed 2013.  EXAM: TRANSABDOMINAL AND TRANSVAGINAL ULTRASOUND OF PELVIS  DOPPLER ULTRASOUND OF OVARIES  TECHNIQUE: Both transabdominal and transvaginal ultrasound examinations of the pelvis were performed. Transabdominal technique was performed for global imaging of the pelvis including uterus, ovaries, adnexal regions, and pelvic cul-de-sac.  It was necessary to proceed with endovaginal exam following the transabdominal exam to visualize the ovaries. Color and duplex Doppler ultrasound was utilized to evaluate blood flow to the ovaries.  COMPARISON:  None.  FINDINGS: Uterus  Measurements: 7.7 x 4.0 x 4.3 cm. No fibroids or other mass visualized. The IUD is visualized and in good position.  Endometrium  Thickness: 4 mm.  No focal abnormality visualized.  Right ovary  Measurements: 3.5 x 1.8 x 1.6 cm, within normal limits. Normal appearance/no adnexal mass.  Left ovary  Measurements: 3.3 x 1.9 x 1.4 cm, within normal limits. Normal appearance/no adnexal mass.  Pulsed Doppler evaluation of both ovaries demonstrates normal low-resistance arterial and  venous waveforms.  Other findings  No free fluid.  IMPRESSION: 1. Normal sonographic appearance of the uterus and ovaries. 2. No free fluid. 3. Normal  color Doppler flow and vascular waveforms within both ovaries.   Electronically Signed   By: Marin Roberts M.D.   On: 06/21/2014 13:34   US Pelvis Complete  06/21/2014   CLINICAL DATA:  Abdominal pain. Bilateral pelvic pain for 4 months. Mirena IUD placed 2013.  EXAM: TRANSABDOMINAL AND TRANSVAGINAL ULTRASOUND OF PELVIS  DOPPLER ULTRASOUND OF OVARIES  TECHNIQUE: Both transabdominal and transvaginal ultrasound examinations of the pelvis were performed. Transabdominal technique was performed for global imaging of the pelvis including uterus, ovaries, adnexal regions, and pelvic cul-de-sac.  It was necessary to proceed with endovaginal exam following the transabdominal exam to visualize the ovaries. Color and duplex Doppler ultrasound was utilized to evaluate blood flow to the ovaries.  COMPARISON:  None.  FINDINGS: Uterus  Measurements: 7.7 x 4.0 x 4.3 cm. No fibroids or other mass visualized. The IUD is visualized and in good position.  Endometrium  Thickness: 4 mm.  No focal abnormality visualized.  Right ovary  Measurements: 3.5 x 1.8 x 1.6 cm, within normal limits. Normal appearance/no adnexal mass.  Left ovary  Measurements: 3.3 x 1.9 x 1.4 cm, within normal limits. Normal appearance/no adnexal mass.  Pulsed Doppler evaluation of both ovaries demonstrates normal low-resistance arterial and venous waveforms.  Other findings  No free fluid.  IMPRESSION: 1. Normal sonographic appearance of the uterus and ovaries. 2. No free fluid. 3. Normal color Doppler flow and vascular waveforms within both ovaries.   Electronically Signed   By: Marin Roberts M.D.   On: 06/21/2014 13:34   Ct Abdomen Pelvis W Contrast  06/21/2014   CLINICAL DATA:  Lower abdominal pain radiating to both flanks, with progression over recent several months.  EXAM: CT ABDOMEN AND PELVIS WITH CONTRAST  TECHNIQUE: Multidetector CT imaging of the abdomen and pelvis was performed using the standard protocol following bolus administration  of intravenous contrast.  CONTRAST:  80mL OMNIPAQUE IOHEXOL 300 MG/ML  SOLN  COMPARISON:  None.  FINDINGS: Lower Chest:  Unremarkable.  Hepatobiliary: No masses or other significant abnormality identified. Gallbladder is unremarkable.  Pancreas: No mass, inflammatory changes, or other significant abnormality identified.  Spleen:  Within normal limits in size and appearance.  Adrenals:  No masses identified.  Kidneys/Urinary Tract:  No evidence of masses or hydronephrosis.  Stomach/Bowel/Peritoneum: No evidence of wall thickening, mass, or obstruction.  Vascular/Lymphatic: No pathologically enlarged lymph nodes identified. No other significant abnormality visualized.  Reproductive: IUD is malpositioned upside down within the endometrial cavity of the uterus. Adnexal regions are unremarkable.  Other:  None.  Musculoskeletal:  No suspicious bone lesions identified.  IMPRESSION: Malpositioned IUD which is up side down within the endometrial cavity.  No other significant abnormality identified within the abdomen or pelvis.   Electronically Signed   By: Myles Rosenthal M.D.   On: 06/21/2014 15:24   US Renal  06/21/2014   CLINICAL DATA:  Bilateral flank pain for 4 months.  EXAM: RENAL / URINARY TRACT ULTRASOUND COMPLETE  COMPARISON:  None.  FINDINGS: Right Kidney:  Length: 11.8 cm. Echogenicity within normal limits. No mass or hydronephrosis visualized.  Left Kidney:  Length: 11.8 cm. Echogenicity within normal limits. No mass or hydronephrosis visualized.  Bladder:  Appears normal for degree of bladder distention.  IMPRESSION: Unremarkable renal ultrasound.   Electronically Signed   By: Charlett Nose M.D.  On: 06/21/2014 13:24   [place] Art/ven Flow Abd Pelv Doppler  06/21/2014   CLINICAL DATA:  Abdominal pain. Bilateral pelvic pain for 4 months. Mirena IUD placed 2013.  EXAM: TRANSABDOMINAL AND TRANSVAGINAL ULTRASOUND OF PELVIS  DOPPLER ULTRASOUND OF OVARIES  TECHNIQUE: Both transabdominal and transvaginal ultrasound  examinations of the pelvis were performed. Transabdominal technique was performed for global imaging of the pelvis including uterus, ovaries, adnexal regions, and pelvic cul-de-sac.  It was necessary to proceed with endovaginal exam following the transabdominal exam to visualize the ovaries. Color and duplex Doppler ultrasound was utilized to evaluate blood flow to the ovaries.  COMPARISON:  None.  FINDINGS: Uterus  Measurements: 7.7 x 4.0 x 4.3 cm. No fibroids or other mass visualized. The IUD is visualized and in good position.  Endometrium  Thickness: 4 mm.  No focal abnormality visualized.  Right ovary  Measurements: 3.5 x 1.8 x 1.6 cm, within normal limits. Normal appearance/no adnexal mass.  Left ovary  Measurements: 3.3 x 1.9 x 1.4 cm, within normal limits. Normal appearance/no adnexal mass.  Pulsed Doppler evaluation of both ovaries demonstrates normal low-resistance arterial and venous waveforms.  Other findings  No free fluid.  IMPRESSION: 1. Normal sonographic appearance of the uterus and ovaries. 2. No free fluid. 3. Normal color Doppler flow and vascular waveforms within both ovaries.   Electronically Signed   By: Marin Robertshristopher  Mattern M.D.   On: 06/21/2014 13:34    Assessment & Plan:  Jamal Maesried to talk patient out of taking IUD out but she insisted she wanted it removed so IUD was removed as per her request. Ongoing treatment for PID; continue Doxycycline as prescribed. Depo Provera started today for contraception, will receive every 3 months Counseled about HPV vaccine, patient agreed to get this today. HPV vaccine given, will get 2nd dose in 6 weeks and also get pap smear (with GC/Chlam TOC) at that same visit. Routine preventative health maintenance measures emphasized. Please refer to After Visit Summary for other counseling recommendations.    Jaynie CollinsUGONNA  Xinyi Batton, MD, FACOG Attending Obstetrician & Gynecologist Center for [facility], [facility]

## 2014-07-02 ENCOUNTER — Encounter: Payer: Self-pay | Admitting: General Practice

## 2014-07-17 ENCOUNTER — Encounter: Payer: Medicaid Other | Admitting: Obstetrics & Gynecology

## 2014-08-09 ENCOUNTER — Ambulatory Visit: Payer: Medicaid Other | Admitting: Obstetrics & Gynecology

## 2014-08-28 ENCOUNTER — Other Ambulatory Visit (HOSPITAL_COMMUNITY)
Admission: RE | Admit: 2014-08-28 | Discharge: 2014-08-28 | Disposition: A | Discharge status: Home / Self Care | Payer: Medicaid Other | Source: Ambulatory Visit | Attending: Family Medicine | Admitting: Family Medicine

## 2014-08-28 ENCOUNTER — Ambulatory Visit (INDEPENDENT_AMBULATORY_CARE_PROVIDER_SITE_OTHER): Payer: Medicaid Other | Admitting: Family Medicine

## 2014-08-28 ENCOUNTER — Encounter: Payer: Self-pay | Admitting: Family Medicine

## 2014-08-28 VITALS — BP 106/60 | HR 96 | Temp 98.4°F | Wt 137.0 lb

## 2014-08-28 DIAGNOSIS — Z124 Encounter for screening for malignant neoplasm of cervix: Secondary | ICD-10-CM | POA: Diagnosis not present

## 2014-08-28 DIAGNOSIS — Z01419 Encounter for gynecological examination (general) (routine) without abnormal findings: Secondary | ICD-10-CM | POA: Insufficient documentation

## 2014-08-28 DIAGNOSIS — Z23 Encounter for immunization: Secondary | ICD-10-CM | POA: Diagnosis not present

## 2014-08-28 DIAGNOSIS — Z113 Encounter for screening for infections with a predominantly sexual mode of transmission: Secondary | ICD-10-CM | POA: Insufficient documentation

## 2014-08-28 NOTE — Progress Notes (Signed)
  Subjective:     Sylvia Green is a 19 y.o. female and is here for a comprehensive physical exam. The patient reports no problems. Previously had IUD removed and now on Depo. No issues.  She is not having any further pain form PID.  Social History   Social History  . Marital Status: Single    Spouse Name: N/A  . Number of Children: N/A  . Years of Education: N/A   Occupational History  . Not on file.   Social History Main Topics  . Smoking status: Never Smoker   . Smokeless tobacco: Never Used  . Alcohol Use: No  . Drug Use: No  . Sexual Activity:    Partners: Male    Birth Control/ Protection: IUD     Comment: IUD placed 09/2011    Other Topics Concern  . Not on file   Social History Narrative   Unemployed, lives with mother, Antony Contras 21 yo)   Health Maintenance  Topic Date Due  . PAP SMEAR  08/04/2011  . INFLUENZA VACCINE  08/19/2014  . CHLAMYDIA SCREENING  06/18/2015  . TETANUS/TDAP  07/29/2022  . HIV Screening  Completed    The following portions of the patient's history were reviewed and updated as appropriate: allergies, current medications, past family history, past medical history, past social history, past surgical history and problem list.  Review of Systems A comprehensive review of systems was negative.   Objective:    BP 106/60 mmHg  Pulse 96  Temp(Src) 98.4 F (36.9 C)  Wt 137 lb (62.143 kg)  SpO2 99% General appearance: alert, cooperative and appears stated age Head: Normocephalic, without obvious abnormality, atraumatic Neck: no adenopathy, supple, symmetrical, trachea midline and thyroid not enlarged, symmetric, no tenderness/mass/nodules Lungs: clear to auscultation bilaterally Breasts: normal appearance, no masses or tenderness Heart: regular rate and rhythm, S1, S2 normal, no murmur, click, rub or gallop Abdomen: soft, non-tender; bowel sounds normal; no masses,  no organomegaly Pelvic: cervix normal in appearance, external  genitalia normal, no adnexal masses or tenderness, no cervical motion tenderness, uterus normal size, shape, and consistency and vagina normal without discharge Extremities: extremities normal, atraumatic, no cyanosis or edema Pulses: 2+ and symmetric Skin: Skin color, texture, turgor normal. No rashes or lesions Lymph nodes: Cervical, supraclavicular, and axillary nodes normal. Neurologic: Grossly normal    Assessment:    Healthy female exam.      Plan:   Problem List Items Addressed This Visit    None    Visit Diagnoses    Need for HPV vaccine    -  Primary    Relevant Orders    HPV 9-valent vaccine,Recombinat    HPV 9-valent vaccine,Recombinat    Screening for malignant neoplasm of cervix        Relevant Orders    Cytology - PAP    Encounter for routine gynecological examination             See After Visit Summary for Counseling Recommendations

## 2014-08-28 NOTE — Progress Notes (Signed)
Pt present today for pap smear and GC/Chamydia.

## 2014-08-28 NOTE — Patient Instructions (Addendum)
Preventive Care for Adults A healthy lifestyle and preventive care can promote health and wellness. Preventive health guidelines for women include the following key practices.  A routine yearly physical is a good way to check with your health care provider about your health and preventive screening. It is a chance to share any concerns and updates on your health and to receive a thorough exam.  Visit your dentist for a routine exam and preventive care every 6 months. Brush your teeth twice a day and floss once a day. Good oral hygiene prevents tooth decay and gum disease.  The frequency of eye exams is based on your age, health, family medical history, use of contact lenses, and other factors. Follow your health care provider's recommendations for frequency of eye exams.  Eat a healthy diet. Foods like vegetables, fruits, whole grains, low-fat dairy products, and lean protein foods contain the nutrients you need without too many calories. Decrease your intake of foods high in solid fats, added sugars, and salt. Eat the right amount of calories for you.Get information about a proper diet from your health care provider, if necessary.  Regular physical exercise is one of the most important things you can do for your health. Most adults should get at least 150 minutes of moderate-intensity exercise (any activity that increases your heart rate and causes you to sweat) each week. In addition, most adults need muscle-strengthening exercises on 2 or more days a week.  Maintain a healthy weight. The body mass index (BMI) is a screening tool to identify possible weight problems. It provides an estimate of body fat based on height and weight. Your health care provider can find your BMI and can help you achieve or maintain a healthy weight.For adults 20 years and older:  A BMI below 18.5 is considered underweight.  A BMI of 18.5 to 24.9 is normal.  A BMI of 25 to 29.9 is considered overweight.  A BMI of  30 and above is considered obese.  Maintain normal blood lipids and cholesterol levels by exercising and minimizing your intake of saturated fat. Eat a balanced diet with plenty of fruit and vegetables. Blood tests for lipids and cholesterol should begin at age 20 and be repeated every 5 years. If your lipid or cholesterol levels are high, you are over 50, or you are at high risk for heart disease, you may need your cholesterol levels checked more frequently.Ongoing high lipid and cholesterol levels should be treated with medicines if diet and exercise are not working.  If you smoke, find out from your health care provider how to quit. If you do not use tobacco, do not start.  Lung cancer screening is recommended for adults aged 55-80 years who are at high risk for developing lung cancer because of a history of smoking. A yearly low-dose CT scan of the lungs is recommended for people who have at least a 30-pack-year history of smoking and are a current smoker or have quit within the past 15 years. A pack year of smoking is smoking an average of 1 pack of cigarettes a day for 1 year (for example: 1 pack a day for 30 years or 2 packs a day for 15 years). Yearly screening should continue until the smoker has stopped smoking for at least 15 years. Yearly screening should be stopped for people who develop a health problem that would prevent them from having lung cancer treatment.  If you are pregnant, do not drink alcohol. If you are breastfeeding,   be very cautious about drinking alcohol. If you are not pregnant and choose to drink alcohol, do not have more than 1 drink per day. One drink is considered to be 12 ounces (355 mL) of beer, 5 ounces (148 mL) of wine, or 1.5 ounces (44 mL) of liquor.  Avoid use of street drugs. Do not share needles with anyone. Ask for help if you need support or instructions about stopping the use of drugs.  High blood pressure causes heart disease and increases the risk of  stroke. Your blood pressure should be checked at least every 1 to 2 years. Ongoing high blood pressure should be treated with medicines if weight loss and exercise do not work.  If you are 3-86 years old, ask your health care provider if you should take aspirin to prevent strokes.  Diabetes screening involves taking a blood sample to check your fasting blood sugar level. This should be done once every 3 years, after age 67, if you are within normal weight and without risk factors for diabetes. Testing should be considered at a younger age or be carried out more frequently if you are overweight and have at least 1 risk factor for diabetes.  Breast cancer screening is essential preventive care for women. You should practice "breast self-awareness." This means understanding the normal appearance and feel of your breasts and may include breast self-examination. Any changes detected, no matter how small, should be reported to a health care provider. Women in their 8s and 30s should have a clinical breast exam (CBE) by a health care provider as part of a regular health exam every 1 to 3 years. After age 70, women should have a CBE every year. Starting at age 25, women should consider having a mammogram (breast X-ray test) every year. Women who have a family history of breast cancer should talk to their health care provider about genetic screening. Women at a high risk of breast cancer should talk to their health care providers about having an MRI and a mammogram every year.  Breast cancer gene (BRCA)-related cancer risk assessment is recommended for women who have family members with BRCA-related cancers. BRCA-related cancers include breast, ovarian, tubal, and peritoneal cancers. Having family members with these cancers may be associated with an increased risk for harmful changes (mutations) in the breast cancer genes BRCA1 and BRCA2. Results of the assessment will determine the need for genetic counseling and  BRCA1 and BRCA2 testing.  Routine pelvic exams to screen for cancer are no longer recommended for nonpregnant women who are considered low risk for cancer of the pelvic organs (ovaries, uterus, and vagina) and who do not have symptoms. Ask your health care provider if a screening pelvic exam is right for you.  If you have had past treatment for cervical cancer or a condition that could lead to cancer, you need Pap tests and screening for cancer for at least 20 years after your treatment. If Pap tests have been discontinued, your risk factors (such as having a new sexual partner) need to be reassessed to determine if screening should be resumed. Some women have medical problems that increase the chance of getting cervical cancer. In these cases, your health care provider may recommend more frequent screening and Pap tests.  The HPV test is an additional test that may be used for cervical cancer screening. The HPV test looks for the virus that can cause the cell changes on the cervix. The cells collected during the Pap test can be  tested for HPV. The HPV test could be used to screen women aged 30 years and older, and should be used in women of any age who have unclear Pap test results. After the age of 30, women should have HPV testing at the same frequency as a Pap test.  Colorectal cancer can be detected and often prevented. Most routine colorectal cancer screening begins at the age of 50 years and continues through age 75 years. However, your health care provider may recommend screening at an earlier age if you have risk factors for colon cancer. On a yearly basis, your health care provider may provide home test kits to check for hidden blood in the stool. Use of a small camera at the end of a tube, to directly examine the colon (sigmoidoscopy or colonoscopy), can detect the earliest forms of colorectal cancer. Talk to your health care provider about this at age 50, when routine screening begins. Direct  exam of the colon should be repeated every 5-10 years through age 75 years, unless early forms of pre-cancerous polyps or small growths are found.  People who are at an increased risk for hepatitis B should be screened for this virus. You are considered at high risk for hepatitis B if:  You were born in a country where hepatitis B occurs often. Talk with your health care provider about which countries are considered high risk.  Your parents were born in a high-risk country and you have not received a shot to protect against hepatitis B (hepatitis B vaccine).  You have HIV or AIDS.  You use needles to inject street drugs.  You live with, or have sex with, someone who has hepatitis B.  You get hemodialysis treatment.  You take certain medicines for conditions like cancer, organ transplantation, and autoimmune conditions.  Hepatitis C blood testing is recommended for all people born from 1945 through 1965 and any individual with known risks for hepatitis C.  Practice safe sex. Use condoms and avoid high-risk sexual practices to reduce the spread of sexually transmitted infections (STIs). STIs include gonorrhea, chlamydia, syphilis, trichomonas, herpes, HPV, and human immunodeficiency virus (HIV). Herpes, HIV, and HPV are viral illnesses that have no cure. They can result in disability, cancer, and death.  You should be screened for sexually transmitted illnesses (STIs) including gonorrhea and chlamydia if:  You are sexually active and are younger than 24 years.  You are older than 24 years and your health care provider tells you that you are at risk for this type of infection.  Your sexual activity has changed since you were last screened and you are at an increased risk for chlamydia or gonorrhea. Ask your health care provider if you are at risk.  If you are at risk of being infected with HIV, it is recommended that you take a prescription medicine daily to prevent HIV infection. This is  called preexposure prophylaxis (PrEP). You are considered at risk if:  You are a heterosexual woman, are sexually active, and are at increased risk for HIV infection.  You take drugs by injection.  You are sexually active with a partner who has HIV.  Talk with your health care provider about whether you are at high risk of being infected with HIV. If you choose to begin PrEP, you should first be tested for HIV. You should then be tested every 3 months for as long as you are taking PrEP.  Osteoporosis is a disease in which the bones lose minerals and strength   with aging. This can result in serious bone fractures or breaks. The risk of osteoporosis can be identified using a bone density scan. Women ages 65 years and over and women at risk for fractures or osteoporosis should discuss screening with their health care providers. Ask your health care provider whether you should take a calcium supplement or vitamin D to reduce the rate of osteoporosis.  Menopause can be associated with physical symptoms and risks. Hormone replacement therapy is available to decrease symptoms and risks. You should talk to your health care provider about whether hormone replacement therapy is right for you.  Use sunscreen. Apply sunscreen liberally and repeatedly throughout the day. You should seek shade when your shadow is shorter than you. Protect yourself by wearing long sleeves, pants, a wide-brimmed hat, and sunglasses year round, whenever you are outdoors.  Once a month, do a whole body skin exam, using a mirror to look at the skin on your back. Tell your health care provider of new moles, moles that have irregular borders, moles that are larger than a pencil eraser, or moles that have changed in shape or color.  Stay current with required vaccines (immunizations).  Influenza vaccine. All adults should be immunized every year.  Tetanus, diphtheria, and acellular pertussis (Td, Tdap) vaccine. Pregnant women should  receive 1 dose of Tdap vaccine during each pregnancy. The dose should be obtained regardless of the length of time since the last dose. Immunization is preferred during the 27th-36th week of gestation. An adult who has not previously received Tdap or who does not know her vaccine status should receive 1 dose of Tdap. This initial dose should be followed by tetanus and diphtheria toxoids (Td) booster doses every 10 years. Adults with an unknown or incomplete history of completing a 3-dose immunization series with Td-containing vaccines should begin or complete a primary immunization series including a Tdap dose. Adults should receive a Td booster every 10 years.  Varicella vaccine. An adult without evidence of immunity to varicella should receive 2 doses or a second dose if she has previously received 1 dose. Pregnant females who do not have evidence of immunity should receive the first dose after pregnancy. This first dose should be obtained before leaving the health care facility. The second dose should be obtained 4-8 weeks after the first dose.  Human papillomavirus (HPV) vaccine. Females aged 13-26 years who have not received the vaccine previously should obtain the 3-dose series. The vaccine is not recommended for use in pregnant females. However, pregnancy testing is not needed before receiving a dose. If a female is found to be pregnant after receiving a dose, no treatment is needed. In that case, the remaining doses should be delayed until after the pregnancy. Immunization is recommended for any person with an immunocompromised condition through the age of 26 years if she did not get any or all doses earlier. During the 3-dose series, the second dose should be obtained 4-8 weeks after the first dose. The third dose should be obtained 24 weeks after the first dose and 16 weeks after the second dose.  Zoster vaccine. One dose is recommended for adults aged 60 years or older unless certain conditions are  present.  Measles, mumps, and rubella (MMR) vaccine. Adults born before 1957 generally are considered immune to measles and mumps. Adults born in 1957 or later should have 1 or more doses of MMR vaccine unless there is a contraindication to the vaccine or there is laboratory evidence of immunity to   each of the three diseases. A routine second dose of MMR vaccine should be obtained at least 28 days after the first dose for students attending postsecondary schools, health care workers, or international travelers. People who received inactivated measles vaccine or an unknown type of measles vaccine during 1963-1967 should receive 2 doses of MMR vaccine. People who received inactivated mumps vaccine or an unknown type of mumps vaccine before 1979 and are at high risk for mumps infection should consider immunization with 2 doses of MMR vaccine. For females of childbearing age, rubella immunity should be determined. If there is no evidence of immunity, females who are not pregnant should be vaccinated. If there is no evidence of immunity, females who are pregnant should delay immunization until after pregnancy. Unvaccinated health care workers born before 1957 who lack laboratory evidence of measles, mumps, or rubella immunity or laboratory confirmation of disease should consider measles and mumps immunization with 2 doses of MMR vaccine or rubella immunization with 1 dose of MMR vaccine.  Pneumococcal 13-valent conjugate (PCV13) vaccine. When indicated, a person who is uncertain of her immunization history and has no record of immunization should receive the PCV13 vaccine. An adult aged 19 years or older who has certain medical conditions and has not been previously immunized should receive 1 dose of PCV13 vaccine. This PCV13 should be followed with a dose of pneumococcal polysaccharide (PPSV23) vaccine. The PPSV23 vaccine dose should be obtained at least 8 weeks after the dose of PCV13 vaccine. An adult aged 19  years or older who has certain medical conditions and previously received 1 or more doses of PPSV23 vaccine should receive 1 dose of PCV13. The PCV13 vaccine dose should be obtained 1 or more years after the last PPSV23 vaccine dose.  Pneumococcal polysaccharide (PPSV23) vaccine. When PCV13 is also indicated, PCV13 should be obtained first. All adults aged 65 years and older should be immunized. An adult younger than age 65 years who has certain medical conditions should be immunized. Any person who resides in a nursing home or long-term care facility should be immunized. An adult smoker should be immunized. People with an immunocompromised condition and certain other conditions should receive both PCV13 and PPSV23 vaccines. People with human immunodeficiency virus (HIV) infection should be immunized as soon as possible after diagnosis. Immunization during chemotherapy or radiation therapy should be avoided. Routine use of PPSV23 vaccine is not recommended for American Indians, [place] Natives, or people younger than 65 years unless there are medical conditions that require PPSV23 vaccine. When indicated, people who have unknown immunization and have no record of immunization should receive PPSV23 vaccine. One-time revaccination 5 years after the first dose of PPSV23 is recommended for people aged 19-64 years who have chronic kidney failure, nephrotic syndrome, asplenia, or immunocompromised conditions. People who received 1-2 doses of PPSV23 before age 65 years should receive another dose of PPSV23 vaccine at age 65 years or later if at least 5 years have passed since the previous dose. Doses of PPSV23 are not needed for people immunized with PPSV23 at or after age 65 years.  Meningococcal vaccine. Adults with asplenia or persistent complement component deficiencies should receive 2 doses of quadrivalent meningococcal conjugate (MenACWY-D) vaccine. The doses should be obtained at least 2 months apart.  Microbiologists working with certain meningococcal bacteria, military recruits, people at risk during an outbreak, and people who travel to or live in countries with a high rate of meningitis should be immunized. A first-year college student up through age   21 years who is living in a residence hall should receive a dose if she did not receive a dose on or after her 16th birthday. Adults who have certain high-risk conditions should receive one or more doses of vaccine.  Hepatitis A vaccine. Adults who wish to be protected from this disease, have certain high-risk conditions, work with hepatitis A-infected animals, work in hepatitis A research labs, or travel to or work in countries with a high rate of hepatitis A should be immunized. Adults who were previously unvaccinated and who anticipate close contact with an international adoptee during the first 60 days after arrival in the [place] from a country with a high rate of hepatitis A should be immunized.  Hepatitis B vaccine. Adults who wish to be protected from this disease, have certain high-risk conditions, may be exposed to blood or other infectious body fluids, are household contacts or sex partners of hepatitis B positive people, are clients or workers in certain care facilities, or travel to or work in countries with a high rate of hepatitis B should be immunized.  Haemophilus influenzae type b (Hib) vaccine. A previously unvaccinated person with asplenia or sickle cell disease or having a scheduled splenectomy should receive 1 dose of Hib vaccine. Regardless of previous immunization, a recipient of a hematopoietic stem cell transplant should receive a 3-dose series 6-12 months after her successful transplant. Hib vaccine is not recommended for adults with HIV infection. Preventive Services / Frequency Ages 64 to 68 years  Blood pressure check.** / Every 1 to 2 years.  Lipid and cholesterol check.** / Every 5 years beginning at age  22.  Clinical breast exam.** / Every 3 years for women in their 88s and 53s.  BRCA-related cancer risk assessment.** / For women who have family members with a BRCA-related cancer (breast, ovarian, tubal, or peritoneal cancers).  Pap test.** / Every 2 years from ages 90 through 51. Every 3 years starting at age 21 through age 56 or 3 with a history of 3 consecutive normal Pap tests.  HPV screening.** / Every 3 years from ages 24 through ages 1 to 46 with a history of 3 consecutive normal Pap tests.  Hepatitis C blood test.** / For any individual with known risks for hepatitis C.  Skin self-exam. / Monthly.  Influenza vaccine. / Every year.  Tetanus, diphtheria, and acellular pertussis (Tdap, Td) vaccine.** / Consult your health care provider. Pregnant women should receive 1 dose of Tdap vaccine during each pregnancy. 1 dose of Td every 10 years.  Varicella vaccine.** / Consult your health care provider. Pregnant females who do not have evidence of immunity should receive the first dose after pregnancy.  HPV vaccine. / 3 doses over 6 months, if 72 and younger. The vaccine is not recommended for use in pregnant females. However, pregnancy testing is not needed before receiving a dose.  Measles, mumps, rubella (MMR) vaccine.** / You need at least 1 dose of MMR if you were born in 1957 or later. You may also need a 2nd dose. For females of childbearing age, rubella immunity should be determined. If there is no evidence of immunity, females who are not pregnant should be vaccinated. If there is no evidence of immunity, females who are pregnant should delay immunization until after pregnancy.  Pneumococcal 13-valent conjugate (PCV13) vaccine.** / Consult your health care provider.  Pneumococcal polysaccharide (PPSV23) vaccine.** / 1 to 2 doses if you smoke cigarettes or if you have certain conditions.  Meningococcal vaccine.** /  1 dose if you are age 19 to 21 years and a first-year college  student living in a residence hall, or have one of several medical conditions, you need to get vaccinated against meningococcal disease. You may also need additional booster doses.  Hepatitis A vaccine.** / Consult your health care provider.  Hepatitis B vaccine.** / Consult your health care provider.  Haemophilus influenzae type b (Hib) vaccine.** / Consult your health care provider. Ages 40 to 64 years  Blood pressure check.** / Every 1 to 2 years.  Lipid and cholesterol check.** / Every 5 years beginning at age 20 years.  Lung cancer screening. / Every year if you are aged 55-80 years and have a 30-pack-year history of smoking and currently smoke or have quit within the past 15 years. Yearly screening is stopped once you have quit smoking for at least 15 years or develop a health problem that would prevent you from having lung cancer treatment.  Clinical breast exam.** / Every year after age 40 years.  BRCA-related cancer risk assessment.** / For women who have family members with a BRCA-related cancer (breast, ovarian, tubal, or peritoneal cancers).  Mammogram.** / Every year beginning at age 40 years and continuing for as long as you are in good health. Consult with your health care provider.  Pap test.** / Every 3 years starting at age 30 years through age 65 or 70 years with a history of 3 consecutive normal Pap tests.  HPV screening.** / Every 3 years from ages 30 years through ages 65 to 70 years with a history of 3 consecutive normal Pap tests.  Fecal occult blood test (FOBT) of stool. / Every year beginning at age 50 years and continuing until age 75 years. You may not need to do this test if you get a colonoscopy every 10 years.  Flexible sigmoidoscopy or colonoscopy.** / Every 5 years for a flexible sigmoidoscopy or every 10 years for a colonoscopy beginning at age 50 years and continuing until age 75 years.  Hepatitis C blood test.** / For all people born from 1945 through  1965 and any individual with known risks for hepatitis C.  Skin self-exam. / Monthly.  Influenza vaccine. / Every year.  Tetanus, diphtheria, and acellular pertussis (Tdap/Td) vaccine.** / Consult your health care provider. Pregnant women should receive 1 dose of Tdap vaccine during each pregnancy. 1 dose of Td every 10 years.  Varicella vaccine.** / Consult your health care provider. Pregnant females who do not have evidence of immunity should receive the first dose after pregnancy.  Zoster vaccine.** / 1 dose for adults aged 60 years or older.  Measles, mumps, rubella (MMR) vaccine.** / You need at least 1 dose of MMR if you were born in 1957 or later. You may also need a 2nd dose. For females of childbearing age, rubella immunity should be determined. If there is no evidence of immunity, females who are not pregnant should be vaccinated. If there is no evidence of immunity, females who are pregnant should delay immunization until after pregnancy.  Pneumococcal 13-valent conjugate (PCV13) vaccine.** / Consult your health care provider.  Pneumococcal polysaccharide (PPSV23) vaccine.** / 1 to 2 doses if you smoke cigarettes or if you have certain conditions.  Meningococcal vaccine.** / Consult your health care provider.  Hepatitis A vaccine.** / Consult your health care provider.  Hepatitis B vaccine.** / Consult your health care provider.  Haemophilus influenzae type b (Hib) vaccine.** / Consult your health care provider. Ages 65   years and over  Blood pressure check.** / Every 1 to 2 years.  Lipid and cholesterol check.** / Every 5 years beginning at age 22 years.  Lung cancer screening. / Every year if you are aged 73-80 years and have a 30-pack-year history of smoking and currently smoke or have quit within the past 15 years. Yearly screening is stopped once you have quit smoking for at least 15 years or develop a health problem that would prevent you from having lung cancer  treatment.  Clinical breast exam.** / Every year after age 4 years.  BRCA-related cancer risk assessment.** / For women who have family members with a BRCA-related cancer (breast, ovarian, tubal, or peritoneal cancers).  Mammogram.** / Every year beginning at age 40 years and continuing for as long as you are in good health. Consult with your health care provider.  Pap test.** / Every 3 years starting at age 9 years through age 34 or 91 years with 3 consecutive normal Pap tests. Testing can be stopped between 65 and 70 years with 3 consecutive normal Pap tests and no abnormal Pap or HPV tests in the past 10 years.  HPV screening.** / Every 3 years from ages 57 years through ages 64 or 45 years with a history of 3 consecutive normal Pap tests. Testing can be stopped between 65 and 70 years with 3 consecutive normal Pap tests and no abnormal Pap or HPV tests in the past 10 years.  Fecal occult blood test (FOBT) of stool. / Every year beginning at age 15 years and continuing until age 17 years. You may not need to do this test if you get a colonoscopy every 10 years.  Flexible sigmoidoscopy or colonoscopy.** / Every 5 years for a flexible sigmoidoscopy or every 10 years for a colonoscopy beginning at age 86 years and continuing until age 71 years.  Hepatitis C blood test.** / For all people born from [birth year] through 1967 and any individual with known risks for hepatitis C.  Osteoporosis screening.** / A one-time screening for women ages 83 years and over and women at risk for fractures or osteoporosis.  Skin self-exam. / Monthly.  Influenza vaccine. / Every year.  Tetanus, diphtheria, and acellular pertussis (Tdap/Td) vaccine.** / 1 dose of Td every 10 years.  Varicella vaccine.** / Consult your health care provider.  Zoster vaccine.** / 1 dose for adults aged 61 years or older.  Pneumococcal 13-valent conjugate (PCV13) vaccine.** / Consult your health care provider.  Pneumococcal  polysaccharide (PPSV23) vaccine.** / 1 dose for all adults aged 28 years and older.  Meningococcal vaccine.** / Consult your health care provider.  Hepatitis A vaccine.** / Consult your health care provider.  Hepatitis B vaccine.** / Consult your health care provider.  Haemophilus influenzae type b (Hib) vaccine.** / Consult your health care provider. ** Family history and personal history of risk and conditions may change your health care provider's recommendations. Document Released: 03/02/2001 Document Revised: 05/21/2013 Document Reviewed: 06/01/2010 [facility] Patient Information 2015 [place], [place]. This information is not intended to replace advice given to you by your health care provider. Make sure you discuss any questions you have with your health care provider.

## 2014-08-30 LAB — CYTOLOGY - PAP

## 2014-09-02 ENCOUNTER — Telehealth: Payer: Self-pay | Admitting: General Practice

## 2014-09-02 DIAGNOSIS — A749 Chlamydial infection, unspecified: Secondary | ICD-10-CM

## 2014-09-02 MED ORDER — AZITHROMYCIN 250 MG PO TABS: 1000.0000 mg | ORAL_TABLET | Freq: Once | ORAL | Status: DC

## 2014-09-02 NOTE — Telephone Encounter (Signed)
Patient's recent chlamydia +. Called patient several times but line was busy. STD card filled out. Med ordered

## 2014-09-03 NOTE — Telephone Encounter (Signed)
Attempted to contact pt and received busy signal.  Letter sent to notify pt.

## 2014-09-11 ENCOUNTER — Ambulatory Visit: Payer: Medicaid Other

## 2014-11-25 ENCOUNTER — Encounter: Payer: Self-pay | Admitting: *Deleted

## 2014-12-11 ENCOUNTER — Ambulatory Visit: Payer: Medicaid Other

## 2015-02-12 ENCOUNTER — Encounter: Payer: Medicaid Other | Admitting: Family Medicine

## 2015-04-04 ENCOUNTER — Ambulatory Visit: Payer: Medicaid Other | Admitting: Family Medicine

## 2015-05-07 ENCOUNTER — Emergency Department (HOSPITAL_COMMUNITY)
Admission: EM | Admit: 2015-05-07 | Discharge: 2015-05-08 | Disposition: A | Discharge status: Home / Self Care | Payer: Medicaid Other | Attending: Emergency Medicine | Admitting: Emergency Medicine

## 2015-05-07 ENCOUNTER — Encounter (HOSPITAL_COMMUNITY): Payer: Self-pay | Admitting: *Deleted

## 2015-05-07 DIAGNOSIS — R5383 Other fatigue: Secondary | ICD-10-CM | POA: Insufficient documentation

## 2015-05-07 LAB — I-STAT CHEM 8, ED
BUN: 12 mg/dL (ref 6–20)
Calcium, Ion: 1.2 mmol/L (ref 1.12–1.23)
Chloride: 106 mmol/L (ref 101–111)
Creatinine, Ser: 0.7 mg/dL (ref 0.44–1.00)
Glucose, Bld: 97 mg/dL (ref 65–99)
HEMATOCRIT: 42 % (ref 36.0–46.0)
HEMOGLOBIN: 14.3 g/dL (ref 12.0–15.0)
Potassium: 3.6 mmol/L (ref 3.5–5.1)
SODIUM: 142 mmol/L (ref 135–145)
TCO2: 22 mmol/L (ref 0–100)

## 2015-05-07 NOTE — ED Notes (Signed)
Pt ambulated to the restroom with out difficulty.

## 2015-05-07 NOTE — ED Notes (Signed)
Pt c/o generalized fatigue and weak all over; pt ambulatory to triage without difficulty; pt with equal grips and strength; pt denies N/V/D; pt denies pain; pt denies URI sx

## 2015-05-08 LAB — I-STAT BETA HCG BLOOD, ED (MC, WL, AP ONLY)

## 2015-05-08 LAB — URINE MICROSCOPIC-ADD ON

## 2015-05-08 LAB — URINALYSIS, ROUTINE W REFLEX MICROSCOPIC
GLUCOSE, UA: NEGATIVE mg/dL
Ketones, ur: NEGATIVE mg/dL
Leukocytes, UA: NEGATIVE
Nitrite: NEGATIVE
PH: 5 (ref 5.0–8.0)
Protein, ur: 100 mg/dL — AB
SPECIFIC GRAVITY, URINE: 1.03 (ref 1.005–1.030)

## 2015-05-08 MED ORDER — IPRATROPIUM BROMIDE 0.02 % IN SOLN
0.5000 mg | Freq: Once | RESPIRATORY_TRACT | Status: DC
Start: 2015-05-08 — End: 2015-05-08

## 2015-05-08 MED ORDER — ALBUTEROL (5 MG/ML) CONTINUOUS INHALATION SOLN: 10.0000 mg/h | INHALATION_SOLUTION | Freq: Once | RESPIRATORY_TRACT | Status: DC

## 2015-05-08 MED ORDER — ALBUTEROL SULFATE (2.5 MG/3ML) 0.083% IN NEBU: 5.0000 mg | INHALATION_SOLUTION | Freq: Once | RESPIRATORY_TRACT | Status: DC

## 2015-05-08 MED ORDER — PREDNISONE 20 MG PO TABS: 60.0000 mg | ORAL_TABLET | Freq: Once | ORAL | Status: DC

## 2015-05-08 NOTE — ED Provider Notes (Signed)
CSN: [id number]     Arrival date & time 05/07/15  2330 History   First MD Initiated Contact with Patient 05/07/15 2343     Chief Complaint  Patient presents with  . Fatigue     (Consider location/radiation/quality/duration/timing/severity/associated sxs/prior Treatment) HPI   This is a 20 year old female who presents with generalized weakness and fatigue x1 day. She reports she woke up yesterday with fatigue, generalized weakness and some lightheadedness and dizziness which has persisted most of the day. She reports her symptoms are slightly worsened when standing or walking and nothing relieves her symptoms. She denies headache, fevers, chills, rhinorrhea, cough, sore throat, abdominal pain, nausea, vomiting, diarrhea, chest pain, shortness of breath, dysuria, vaginal discharge, myalgia, anorexia or insomnia. She has felt depressed in the past but she denies suicidal or homicidal ideation at present.   Past Medical History  Diagnosis Date  . Gonorrhea   . Chlamydia infection 06/26/2014   Past Surgical History  Procedure Laterality Date  . No past surgeries     Family History  Problem Relation Age of Onset  . Hypertension Mother   . Depression Mother   . Anxiety disorder Mother   . Hyperlipidemia Mother    Social History  Substance Use Topics  . Smoking status: Never Smoker   . Smokeless tobacco: Never Used  . Alcohol Use: No   OB History    Gravida Para Term Preterm AB TAB SAB Ectopic Multiple Living   2 2 2       2      Review of Systems  Review of Systems All other systems negative except as documented in the HPI. All pertinent positives and negatives as reviewed in the HPI.   Allergies  Review of patient's allergies indicates no known allergies.  Home Medications   Prior to Admission medications   Medication Sig Start Date End Date Taking? Authorizing Provider  ferrous sulfate 325 (65 FE) MG tablet Take 1 tablet (325 mg total) by mouth daily with  breakfast. Patient not taking: Reported on 05/08/2015 10/03/13   Stephanie Couphristopher M Street, MD   BP 104/66 mmHg  Pulse 80  Temp(Src) 98.8 F (37.1 C) (Oral)  Resp 18  SpO2 100%  LMP 04/28/2015 Physical Exam  Constitutional: She appears well-developed and well-nourished. No distress.  HENT:  Head: Normocephalic and atraumatic.  Right Ear: Tympanic membrane and ear canal normal.  Left Ear: Tympanic membrane and ear canal normal.  Nose: Nose normal.  Mouth/Throat: Uvula is midline, oropharynx is clear and moist and mucous membranes are normal.  Eyes: Pupils are equal, round, and reactive to light.  Neck: Normal range of motion. Neck supple.  Cardiovascular: Normal rate and regular rhythm.   Pulmonary/Chest: Effort normal.  Abdominal: Soft.  No signs of abdominal distention  Musculoskeletal:  No LE swelling  Neurological: She is alert.  Acting at baseline  Skin: Skin is warm and dry. No rash noted.  Nursing note and vitals reviewed.   ED Course  Procedures (including critical care time) Labs Review Labs Reviewed  URINALYSIS, ROUTINE W REFLEX MICROSCOPIC (NOT AT [facility]) - Abnormal; Notable for the following:    Color, Urine AMBER (*)    APPearance CLOUDY (*)    Hgb urine dipstick LARGE (*)    Bilirubin Urine SMALL (*)    Protein, ur 100 (*)    All other components within normal limits  URINE MICROSCOPIC-ADD ON - Abnormal; Notable for the following:    Squamous Epithelial / LPF 6-30 (*)  Bacteria, UA MANY (*)    All other components within normal limits  I-STAT BETA HCG BLOOD, ED ([facility], [facility], [facility] ONLY)  I-STAT CHEM 8, ED    Imaging Review No results found. I have personally reviewed and evaluated these images and lab results as part of my medical decision-making.   EKG Interpretation None      MDM   Final diagnoses:  Other fatigue    00:51 Orthostatic Vital Signs MH  Orthostatic Lying  - BP- Lying: 104/66 mmHg ; Pulse- Lying: 80  Orthostatic Sitting - BP- Sitting:  98/65 mmHg ; Pulse- Sitting: 81  Orthostatic Standing at 0 minutes - BP- Standing at 0 minutes: 105/71 mmHg ; Pulse- Standing at 0 minutes: 93      The patient has nonspecific fatigue symptoms that started today. She is not concerned about anything in particular and her mother does not have anything to add to the patients complaints. She has a large amount of hemoglobin in her urine but says she has just finished her cycle. Otherwise her chem 8 is normal and pregnancy test is negative.  She has been given strict return precautions. She does have a PCP, who I recommend she follow-up with. Well/non toxic appearing.  Filed Vitals:   05/07/15 2341 05/08/15 0109  BP: 120/71 104/66  Pulse: 92 80  Temp: 98.8 F (37.1 C)   Resp: 20 [address] Sherian Rein 05/09/15 0037  April Palumbo, MD 05/09/15 [phone]

## 2015-05-08 NOTE — Discharge Instructions (Signed)
Fatigue  Fatigue is feeling tired all of the time, a lack of energy, or a lack of motivation. Occasional or mild fatigue is often a normal response to activity or life in general. However, long-lasting (chronic) or extreme fatigue may indicate an underlying medical condition.  HOME CARE INSTRUCTIONS   Watch your fatigue for any changes. The following actions may help to lessen any discomfort you are feeling:  · Talk to your health care provider about how much sleep you need each night. Try to get the required amount every night.  · Take medicines only as directed by your health care provider.  · Eat a healthy and nutritious diet. Ask your health care provider if you need help changing your diet.  · Drink enough fluid to keep your urine clear or pale yellow.  · Practice ways of relaxing, such as yoga, meditation, massage therapy, or acupuncture.  · Exercise regularly.    · Change situations that cause you stress. Try to keep your work and personal routine reasonable.  · Do not abuse illegal drugs.  · Limit alcohol intake to no more than 1 drink per day for nonpregnant women and 2 drinks per day for men. One drink equals 12 ounces of beer, 5 ounces of wine, or 1½ ounces of hard liquor.  · Take a multivitamin, if directed by your health care provider.  SEEK MEDICAL CARE IF:   · Your fatigue does not get better.  · You have a fever.    · You have unintentional weight loss or gain.  · You have headaches.    · You have difficulty:      Falling asleep.    Sleeping throughout the night.  · You feel angry, guilty, anxious, or sad.     · You are unable to have a bowel movement (constipation).    · You skin is dry.     · Your legs or another part of your body is swollen.    SEEK IMMEDIATE MEDICAL CARE IF:   · You feel confused.    · Your vision is blurry.  · You feel faint or pass out.    · You have a severe headache.    · You have severe abdominal, pelvic, or back pain.    · You have chest pain, shortness of breath, or an  irregular or fast heartbeat.    · You are unable to urinate or you urinate less than normal.    · You develop abnormal bleeding, such as bleeding from the rectum, vagina, nose, lungs, or nipples.  · You vomit blood.     · You have thoughts about harming yourself or committing suicide.    · You are worried that you might harm someone else.       This information is not intended to replace advice given to you by your health care provider. Make sure you discuss any questions you have with your health care provider.     Document Released: 11/01/2006 Document Revised: 01/25/2014 Document Reviewed: 05/08/2013  Elsevier Interactive Patient Education ©2016 [employer]

## 2015-05-08 NOTE — ED Notes (Signed)
PA at bedside.

## 2015-07-30 ENCOUNTER — Encounter (HOSPITAL_COMMUNITY): Payer: Self-pay | Admitting: Nurse Practitioner

## 2015-07-30 ENCOUNTER — Emergency Department (HOSPITAL_COMMUNITY)
Admission: EM | Admit: 2015-07-30 | Discharge: 2015-07-30 | Disposition: A | Discharge status: Home / Self Care | Payer: Medicaid Other | Attending: Emergency Medicine | Admitting: Emergency Medicine

## 2015-07-30 DIAGNOSIS — N898 Other specified noninflammatory disorders of vagina: Secondary | ICD-10-CM | POA: Diagnosis present

## 2015-07-30 HISTORY — DX: Anemia, unspecified: D64.9

## 2015-07-30 LAB — URINALYSIS, ROUTINE W REFLEX MICROSCOPIC
BILIRUBIN URINE: NEGATIVE
GLUCOSE, UA: NEGATIVE mg/dL
Ketones, ur: NEGATIVE mg/dL
Leukocytes, UA: NEGATIVE
Nitrite: NEGATIVE
Protein, ur: NEGATIVE mg/dL
Specific Gravity, Urine: 1.026 (ref 1.005–1.030)
pH: 7 (ref 5.0–8.0)

## 2015-07-30 LAB — CBC
HCT: 41 % (ref 36.0–46.0)
Hemoglobin: 14.1 g/dL (ref 12.0–15.0)
MCH: 31 pg (ref 26.0–34.0)
MCHC: 34.4 g/dL (ref 30.0–36.0)
MCV: 90.1 fL (ref 78.0–100.0)
PLATELETS: 183 10*3/uL (ref 150–400)
RBC: 4.55 MIL/uL (ref 3.87–5.11)
RDW: 13.1 % (ref 11.5–15.5)
WBC: 5.9 10*3/uL (ref 4.0–10.5)

## 2015-07-30 LAB — COMPREHENSIVE METABOLIC PANEL
ALBUMIN: 4.1 g/dL (ref 3.5–5.0)
ALK PHOS: 59 U/L (ref 38–126)
ALT: 15 U/L (ref 14–54)
AST: 18 U/L (ref 15–41)
Anion gap: 5 (ref 5–15)
BUN: 12 mg/dL (ref 6–20)
CALCIUM: 9.6 mg/dL (ref 8.9–10.3)
CHLORIDE: 110 mmol/L (ref 101–111)
CO2: 24 mmol/L (ref 22–32)
CREATININE: 0.82 mg/dL (ref 0.44–1.00)
GFR calc Af Amer: >60 mL/min (ref >60)
GFR calc non Af Amer: >60 mL/min (ref >60)
GLUCOSE: 91 mg/dL (ref 65–99)
Potassium: 4.3 mmol/L (ref 3.5–5.1)
SODIUM: 139 mmol/L (ref 135–145)
Total Bilirubin: 0.4 mg/dL (ref 0.3–1.2)
Total Protein: 7.3 g/dL (ref 6.5–8.1)

## 2015-07-30 LAB — URINE MICROSCOPIC-ADD ON

## 2015-07-30 LAB — I-STAT BETA HCG BLOOD, ED (MC, WL, AP ONLY)

## 2015-07-30 LAB — WET PREP, GENITAL
SPERM: NONE SEEN
TRICH WET PREP: NONE SEEN
Yeast Wet Prep HPF POC: NONE SEEN

## 2015-07-30 MED ORDER — METRONIDAZOLE 500 MG PO TABS: 500.0000 mg | ORAL_TABLET | Freq: Two times a day (BID) | ORAL | Status: DC

## 2015-07-30 MED ORDER — AZITHROMYCIN 250 MG PO TABS
1000.0000 mg | ORAL_TABLET | Freq: Once | ORAL | Status: AC
  Administered 2015-07-30: 1000 mg via ORAL
  Filled 2015-07-30: qty 4

## 2015-07-30 MED ORDER — CEFTRIAXONE SODIUM 250 MG IJ SOLR
250.0000 mg | Freq: Once | INTRAMUSCULAR | Status: AC
Start: 2015-07-30 — End: 2015-07-30
  Administered 2015-07-30: 250 mg via INTRAMUSCULAR
  Filled 2015-07-30: qty 250

## 2015-07-30 MED ORDER — LIDOCAINE HCL (PF) 1 % IJ SOLN
5.0000 mL | Freq: Once | INTRAMUSCULAR | Status: AC
  Administered 2015-07-30: 5 mL
  Filled 2015-07-30: qty 5

## 2015-07-30 NOTE — ED Notes (Signed)
She c/o 1 week history of abd pain and lower back pain. Describes as intermittent sharp pain. She denies n/v, fevers, bowel/bladder changes. She is alert, breathing easily

## 2015-07-30 NOTE — Discharge Instructions (Signed)
Please take all of your antibiotics until finished! Use a condom with every sexual encounter Follow up with your doctor, or OBGYN in regards to today's visit in 1 week.  Please return to the ER for worsening symptoms, high fevers or persistent vomiting.  You have been tested for HIV, syphilis, chlamydia and gonorrhea. These results will be available in approximately 3 days. You will be notified if they are positive.   It is very important to practice safe sex and use condoms when sexually active. If your results are positive you need to notify all sexual partners so they can be treated as well. The website https://garcia.net/http://www.dontspreadit.com/ can be used to send anonymous text messages or emails to alert sexual contacts.   SEEK IMMEDIATE MEDICAL CARE IF:  You develop an oral temperature above 102 F (38.9 C), not controlled by medications or lasting more than 2 days.  You develop an increase in pain.  You develop any type of abnormal discharge.  You develop painful intercourse.

## 2015-07-30 NOTE — ED Provider Notes (Signed)
CSN: [id number]     Arrival date & time 07/30/15  1212 History   First MD Initiated Contact with Patient 07/30/15 1305     Chief Complaint  Patient presents with  . Abdominal Pain    (Consider location/radiation/quality/duration/timing/severity/associated sxs/prior Treatment) Patient is a 20 y.o. female presenting with abdominal pain. The history is provided by the patient and medical records. No language interpreter was used.  Abdominal Pain Associated symptoms: vaginal discharge   Associated symptoms: no chills, no constipation, no cough, no diarrhea, no dysuria, no fever, no nausea, no shortness of breath, no vaginal bleeding and no vomiting     Lakeeta Dobosz is a 20 y.o. female  with a PMH of prior STD in the past who presents to the Emergency Department complaining of intermittent, persistent sharp suprapubic abdominal pain with associated white vaginal discharge x 1 week. No medications taken PTA. No alleviating or aggravating factors noted. Denies dysuria, back pain, chest pain, n/v/d, sob, fevers, vaginal bleeding. Last menses in May.    Past Medical History  Diagnosis Date  . Gonorrhea   . Chlamydia infection 06/26/2014  . Anemia    Past Surgical History  Procedure Laterality Date  . No past surgeries     Family History  Problem Relation Age of Onset  . Hypertension Mother   . Depression Mother   . Anxiety disorder Mother   . Hyperlipidemia Mother    Social History  Substance Use Topics  . Smoking status: Never Smoker   . Smokeless tobacco: Never Used  . Alcohol Use: No   OB History    Gravida Para Term Preterm AB TAB SAB Ectopic Multiple Living   Review of Systems  Constitutional: Negative for fever and chills.  HENT: Negative for congestion.   Eyes: Negative for visual disturbance.  Respiratory: Negative for cough and shortness of breath.   Cardiovascular: Negative.   Gastrointestinal: Positive for abdominal pain. Negative for nausea,  vomiting, diarrhea and constipation.  Genitourinary: Positive for vaginal discharge. Negative for dysuria and vaginal bleeding.  Musculoskeletal: Negative for back pain and neck pain.  Skin: Negative for rash.  Neurological: Negative for headaches.      Allergies  Review of patient's allergies indicates no known allergies.  Home Medications   Prior to Admission medications   Medication Sig Start Date End Date Taking? Authorizing Provider  ferrous sulfate 325 (65 FE) MG tablet Take 1 tablet (325 mg total) by mouth daily with breakfast. Patient not taking: Reported on 05/08/2015 10/03/13   Stephanie Coup Street, MD  metroNIDAZOLE (FLAGYL) 500 MG tablet Take 1 tablet (500 mg total) by mouth 2 (two) times daily. 07/30/15   Jaime Pilcher Ward, PA-C   BP 107/58 mmHg  Pulse 77  Temp(Src) 99.2 F (37.3 C) (Oral)  Resp 18  SpO2 100%  LMP 06/07/2015 Physical Exam  Constitutional: She is oriented to person, place, and time. She appears well-developed and well-nourished. No distress.  HENT:  Head: Normocephalic and atraumatic.  Cardiovascular: Normal rate, regular rhythm and normal heart sounds.  Exam reveals no gallop and no friction rub.   No murmur heard. Pulmonary/Chest: Effort normal and breath sounds normal. No respiratory distress. She has no wheezes. She has no rales. She exhibits no tenderness.  Abdominal: Soft. Bowel sounds are normal. She exhibits no distension. There is tenderness (Suprapubic). There is no rebound and no guarding.  Genitourinary:  Chaperone present for exam. +  white discharge. No rashes, lesions, or tenderness to external genitalia. No erythema, injury, or tenderness to vaginal mucosa. No bleeding within vaginal vault. No adnexal masses, tenderness, or fullness. No CMT.   Neurological: She is alert and oriented to person, place, and time.  Skin: Skin is warm and dry.  Nursing note and vitals reviewed.   ED Course  Procedures (including critical care time) Labs  Review Labs Reviewed  WET PREP, GENITAL - Abnormal; Notable for the following:    Clue Cells Wet Prep HPF POC PRESENT (*)    WBC, Wet Prep HPF POC MANY (*)    All other components within normal limits  URINALYSIS, ROUTINE W REFLEX MICROSCOPIC (NOT AT [facility]) - Abnormal; Notable for the following:    APPearance HAZY (*)    Hgb urine dipstick SMALL (*)    All other components within normal limits  URINE MICROSCOPIC-ADD ON - Abnormal; Notable for the following:    Squamous Epithelial / LPF 6-30 (*)    Bacteria, UA MANY (*)    All other components within normal limits  COMPREHENSIVE METABOLIC PANEL  CBC  RPR  HIV ANTIBODY (ROUTINE TESTING)  I-STAT BETA HCG BLOOD, ED ([facility], [facility], [facility] ONLY)  GC/CHLAMYDIA PROBE AMP ([place]) NOT AT [facility]    Imaging Review No results found. I have personally reviewed and evaluated these images and lab results as part of my medical decision-making.   EKG Interpretation None      MDM   Final diagnoses:  Vaginal discharge   Maeola Harmanliyah Burrill presents to ED for suprapubic abdominal pain and vaginal discharge x 1 week. On exam, patient is afebrile and hemodynamically stable. TTP of suprapubic area and white vaginal discharge on exam. Non-surgical abdomen. CBC, CMP wdl. hcg negative. UA with many bacteria but negative nitrites and leuks. Wet prep shows clue cells and many bacteria. Patient prophylactically treated in ED with azithro and rocephin. Rx for flagyl. G&C as well as HIV, RPR sent and patient informed she will be called if results are positive. Follow up with OBGYN or PCP in regards to today's visit. Return precautions given and all questions answered.   Chase PicketJaime Pilcher Ward, PA-C 07/30/15 1500  Tilden FossaElizabeth Rees, MD 07/31/15 [phone]

## 2015-07-31 LAB — GC/CHLAMYDIA PROBE AMP (~~LOC~~) NOT AT ARMC
CHLAMYDIA, DNA PROBE: NEGATIVE
NEISSERIA GONORRHEA: NEGATIVE

## 2015-07-31 LAB — RPR: RPR: NONREACTIVE

## 2015-07-31 LAB — HIV ANTIBODY (ROUTINE TESTING W REFLEX): HIV SCREEN 4TH GENERATION: NONREACTIVE

## 2015-12-07 IMAGING — US US RENAL
1 series · 14 of 25 positions shown · non-contrast
Comparison: None.

CLINICAL DATA: Bilateral flank pain for 4 months.

EXAM:
RENAL / URINARY TRACT ULTRASOUND COMPLETE

[Series 1: us renal · 0.18mm/px · 14 of 35 slices shown]
[im 1/35]
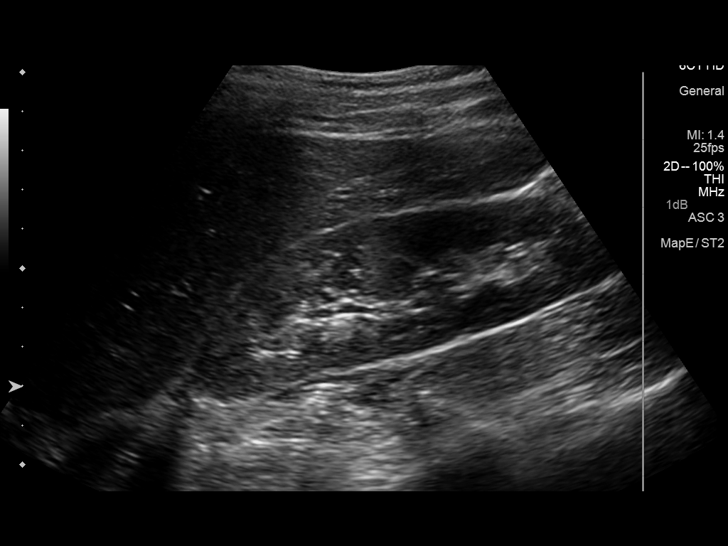
[im 3/35]
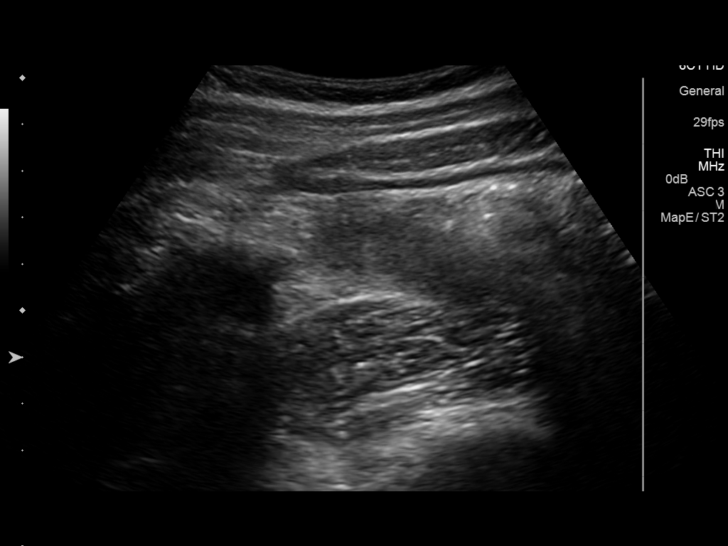
[im 6/35]
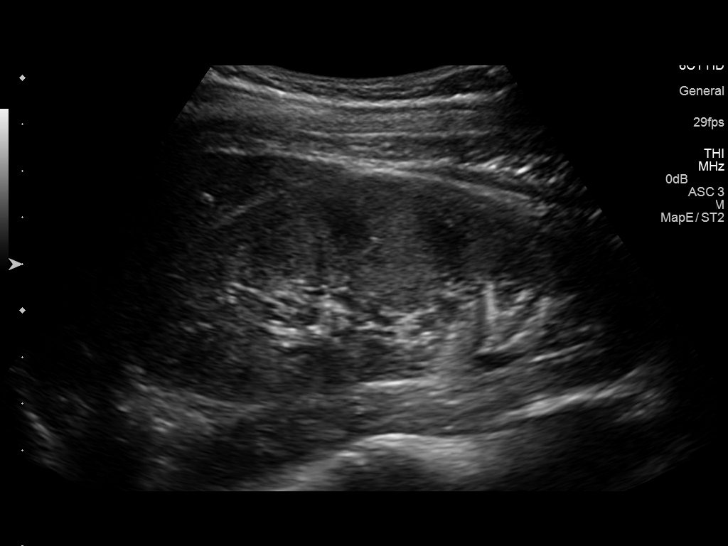
[im 9/35]
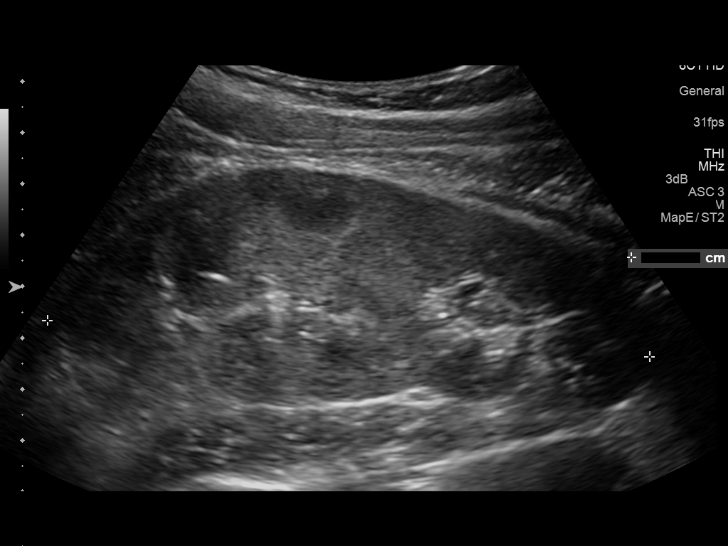
[im 12/35]
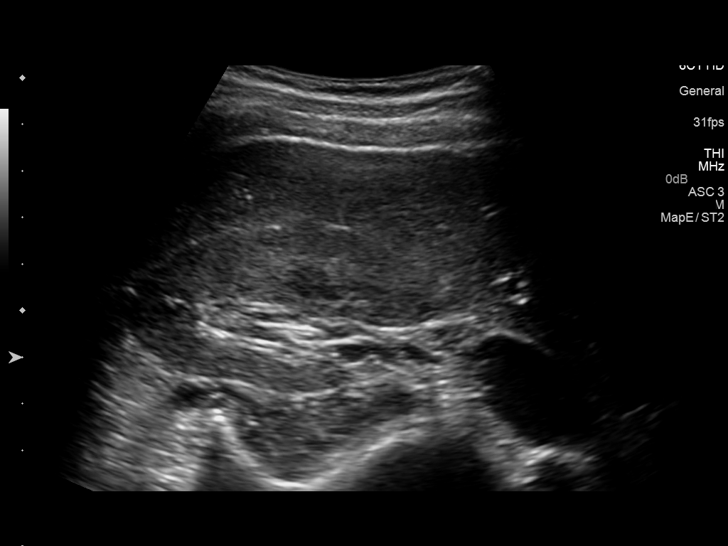
[im 13/35]
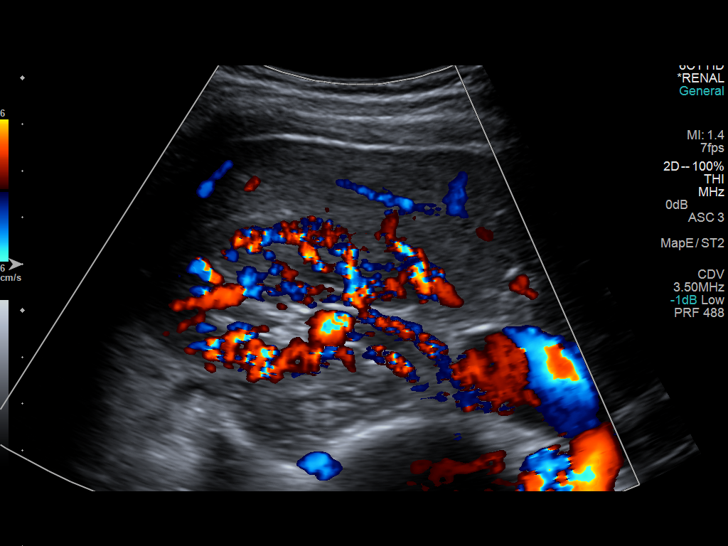
[im 16/35]
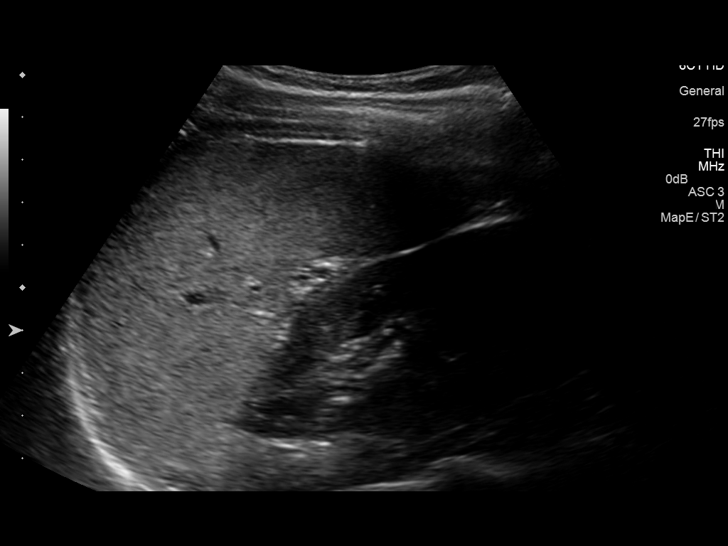
[im 19/35]
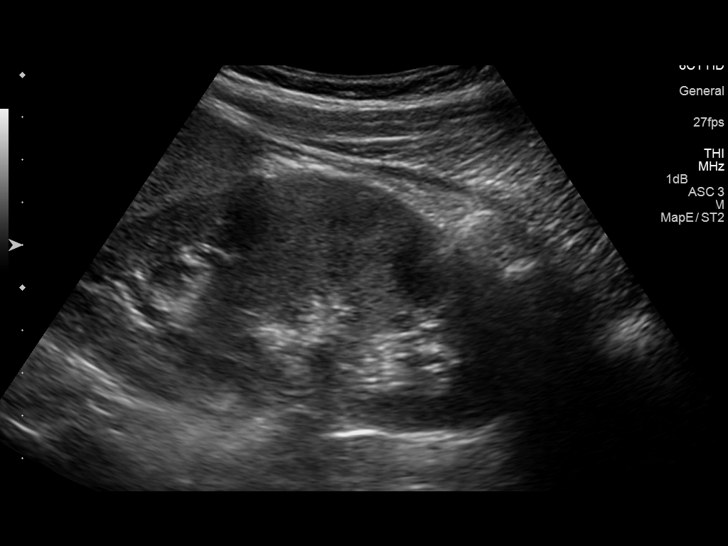
[im 22/35]
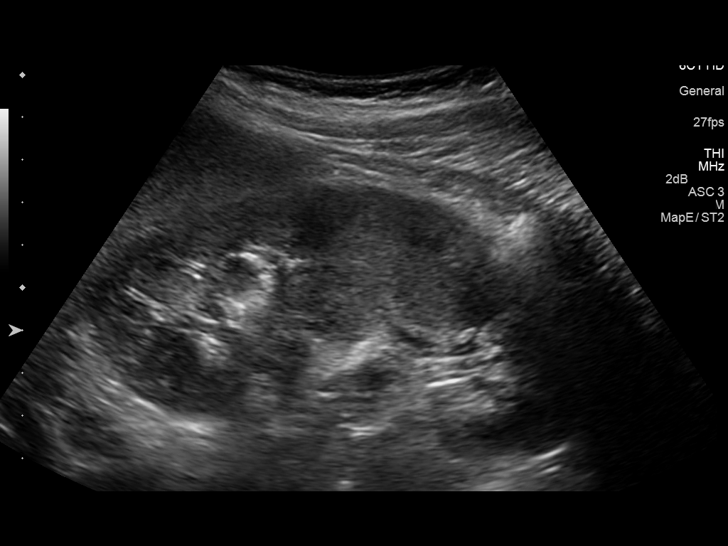
[im 23/35]
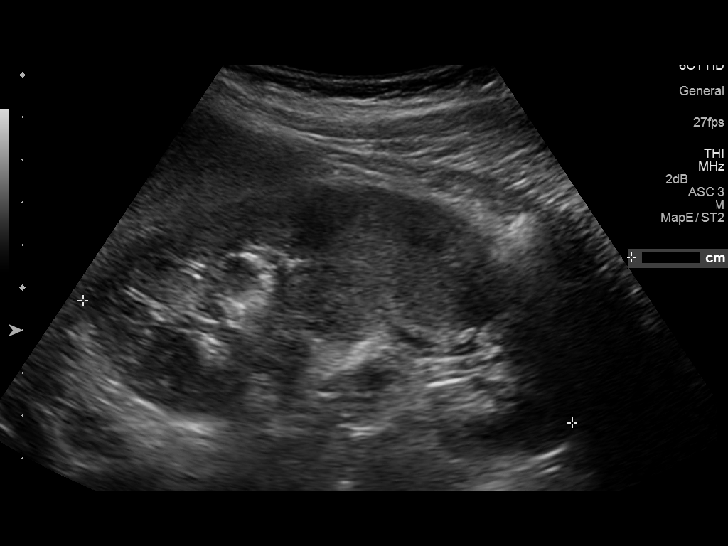
[im 26/35]
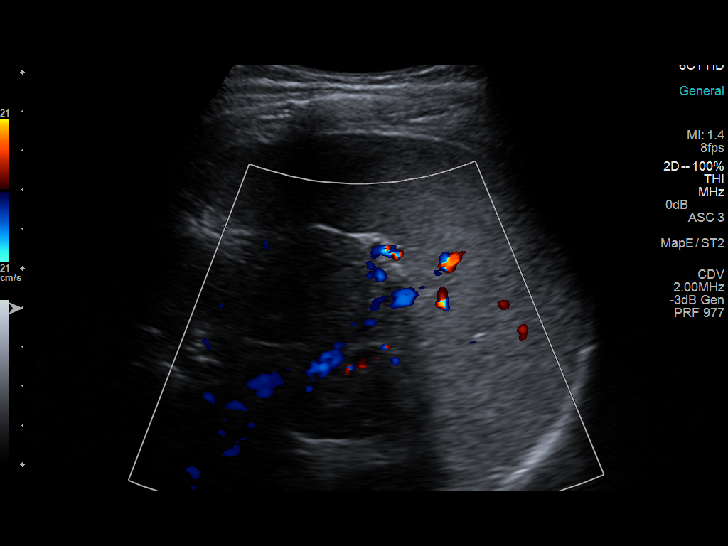
[im 29/35]
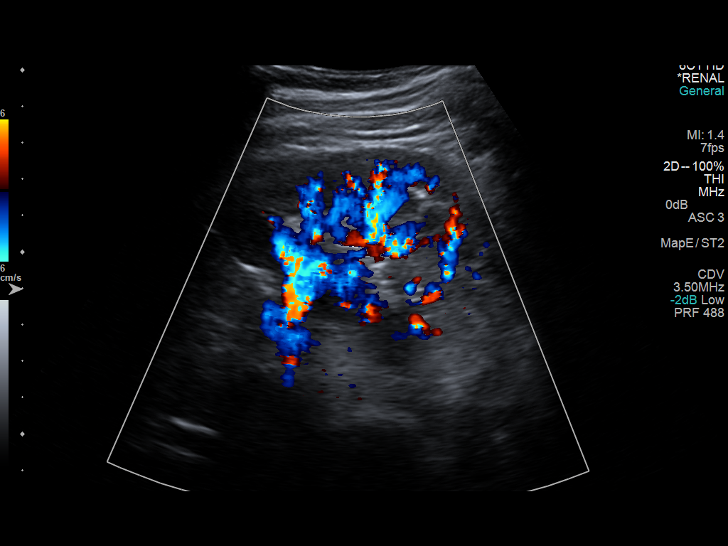
[im 32/35]
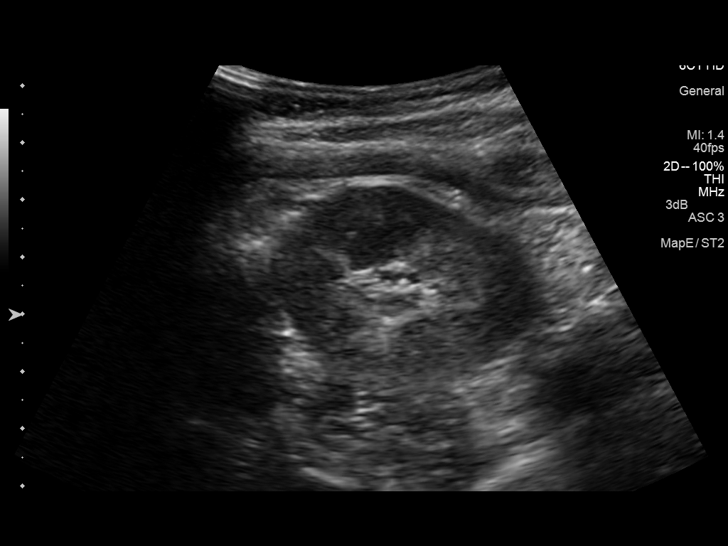
[im 35/35]
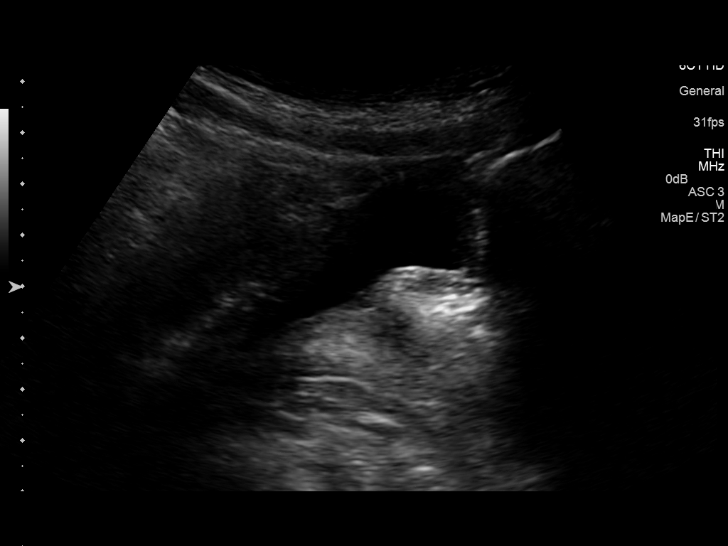

[14 of 25 positions shown; findings below may reference images not displayed]

FINDINGS: Right Kidney:

Length: 11.8 cm. Echogenicity within normal limits. No mass or
hydronephrosis visualized.

Left Kidney:

Length: 11.8 cm. Echogenicity within normal limits. No mass or
hydronephrosis visualized.

Bladder:

Appears normal for degree of bladder distention.
IMPRESSION: Unremarkable renal ultrasound.

## 2016-01-16 ENCOUNTER — Emergency Department (HOSPITAL_COMMUNITY)
Admission: EM | Admit: 2016-01-16 | Discharge: 2016-01-17 | Discharge status: Absent without leave | Payer: Medicaid Other | Source: Home / Self Care

## 2016-01-17 NOTE — ED Notes (Signed)
Pt not in lobby when called for triage.  

## 2016-02-17 ENCOUNTER — Encounter (HOSPITAL_COMMUNITY): Payer: Self-pay | Admitting: Emergency Medicine

## 2016-02-17 ENCOUNTER — Emergency Department (HOSPITAL_COMMUNITY)
Admission: EM | Admit: 2016-02-17 | Discharge: 2016-02-17 | Disposition: A | Discharge status: Home / Self Care | Payer: Medicaid Other | Attending: Emergency Medicine | Admitting: Emergency Medicine

## 2016-02-17 DIAGNOSIS — Z79899 Other long term (current) drug therapy: Secondary | ICD-10-CM | POA: Diagnosis not present

## 2016-02-17 DIAGNOSIS — F329 Major depressive disorder, single episode, unspecified: Secondary | ICD-10-CM | POA: Diagnosis not present

## 2016-02-17 DIAGNOSIS — R45851 Suicidal ideations: Secondary | ICD-10-CM | POA: Diagnosis present

## 2016-02-17 LAB — COMPREHENSIVE METABOLIC PANEL
ALK PHOS: 57 U/L (ref 38–126)
ALT: 11 U/L — ABNORMAL LOW (ref 14–54)
AST: 16 U/L (ref 15–41)
Albumin: 3.9 g/dL (ref 3.5–5.0)
Anion gap: 5 (ref 5–15)
BUN: 8 mg/dL (ref 6–20)
CALCIUM: 9.1 mg/dL (ref 8.9–10.3)
CHLORIDE: 111 mmol/L (ref 101–111)
CO2: 24 mmol/L (ref 22–32)
CREATININE: 0.74 mg/dL (ref 0.44–1.00)
Glucose, Bld: 90 mg/dL (ref 65–99)
Potassium: 3.5 mmol/L (ref 3.5–5.1)
Sodium: 140 mmol/L (ref 135–145)
Total Bilirubin: 0.8 mg/dL (ref 0.3–1.2)
Total Protein: 6.8 g/dL (ref 6.5–8.1)

## 2016-02-17 LAB — RAPID URINE DRUG SCREEN, HOSP PERFORMED
AMPHETAMINES: NOT DETECTED
BARBITURATES: NOT DETECTED
Benzodiazepines: NOT DETECTED
Cocaine: NOT DETECTED
Opiates: NOT DETECTED
TETRAHYDROCANNABINOL: NOT DETECTED

## 2016-02-17 LAB — CBC
HCT: 37.3 % (ref 36.0–46.0)
HEMOGLOBIN: 12.6 g/dL (ref 12.0–15.0)
MCH: 30.7 pg (ref 26.0–34.0)
MCHC: 33.8 g/dL (ref 30.0–36.0)
MCV: 90.8 fL (ref 78.0–100.0)
Platelets: 161 10*3/uL (ref 150–400)
RBC: 4.11 MIL/uL (ref 3.87–5.11)
RDW: 13.2 % (ref 11.5–15.5)
WBC: 6.1 10*3/uL (ref 4.0–10.5)

## 2016-02-17 LAB — I-STAT BETA HCG BLOOD, ED (MC, WL, AP ONLY)

## 2016-02-17 LAB — SALICYLATE LEVEL

## 2016-02-17 LAB — ETHANOL

## 2016-02-17 LAB — ACETAMINOPHEN LEVEL: Acetaminophen (Tylenol), Serum: <10 ug/mL — ABNORMAL LOW (ref 10–30)

## 2016-02-17 MED ORDER — IBUPROFEN 400 MG PO TABS: 600.0000 mg | ORAL_TABLET | Freq: Three times a day (TID) | ORAL | Status: DC | PRN

## 2016-02-17 NOTE — ED Provider Notes (Signed)
[facility]-EMERGENCY DEPT Provider Note   CSN: [id number] Arrival date & time: 02/17/16  1237  By signing my name below, I, Clovis Pu, attest that this documentation has been prepared under the direction and in the presence of  Audry Pili, PA-C. Electronically Signed: Clovis Pu, ED Scribe. 02/17/16. 3:07 PM.  History   Chief Complaint Chief Complaint  Patient presents with  . Suicidal  . Medical Clearance   The history is provided by the patient. No language interpreter was used.   HPI Comments:  Sylvia Green is a 21 y.o. female who presents to the Emergency Department complaining of suicidal ideation x "some time". She notes she tried to choke herself yesterday. She states she sometimes feels like she doesn't want to be here and notes a hx of suicidal ideations when she was a teenager. Pt denies any motives to hurting herself, HI, auditory hallucinations, chest pain, SOB, abdominal pain, nausea, vomiting, diarrhea. No other associated symptoms noted.   Past Medical History:  Diagnosis Date  . Anemia   . Chlamydia infection 06/26/2014  . Gonorrhea     Patient Active Problem List   Diagnosis Date Noted  . Chlamydia infection 06/26/2014  . Eczema, dyshidrotic 05/24/2014  . Anemia 10/03/2013  . Acute depression 02/08/2013  . History of gonorrhea 10/19/2010    Past Surgical History:  Procedure Laterality Date  . NO PAST SURGERIES      OB History    Gravida Para Term Preterm AB Living   2 2 2     2    SAB TAB Ectopic Multiple Live Births           2       Home Medications    Prior to Admission medications   Medication Sig Start Date End Date Taking? Authorizing Provider  ferrous sulfate 325 (65 FE) MG tablet Take 1 tablet (325 mg total) by mouth daily with breakfast. Patient not taking: Reported on 05/08/2015 10/03/13   Stephanie Coup Street, MD  metroNIDAZOLE (FLAGYL) 500 MG tablet Take 1 tablet (500 mg total) by mouth 2 (two) times daily. 07/30/15   Chase Picket Ward,  PA-C    Family History Family History  Problem Relation Age of Onset  . Hypertension Mother   . Depression Mother   . Anxiety disorder Mother   . Hyperlipidemia Mother     Social History Social History  Substance Use Topics  . Smoking status: Never Smoker  . Smokeless tobacco: Never Used  . Alcohol use No     Allergies   Patient has no known allergies.   Review of Systems Review of Systems  Respiratory: Negative for shortness of breath.   Cardiovascular: Negative for chest pain.  Gastrointestinal: Negative for abdominal pain, diarrhea, nausea and vomiting.  Psychiatric/Behavioral: Positive for suicidal ideas. Negative for hallucinations.   Physical Exam Updated Vital Signs BP 116/73 (BP Location: Right Arm)   Pulse 90   Temp 99.2 F (37.3 C) (Oral)   Resp 16   LMP 01/21/2016 (Approximate)   SpO2 100%   Physical Exam  Constitutional: She is oriented to person, place, and time. Vital signs are normal. She appears well-developed and well-nourished. No distress.  HENT:  Head: Normocephalic and atraumatic.  Right Ear: Hearing normal.  Left Ear: Hearing normal.  Eyes: Conjunctivae and EOM are normal. Pupils are equal, round, and reactive to light.  Neck: Normal range of motion. Neck supple.  Cardiovascular: Normal rate, regular rhythm and normal heart sounds.   Pulmonary/Chest: Effort normal  and breath sounds normal. She has no wheezes.  Abdominal: She exhibits no distension.  Musculoskeletal: Normal range of motion.  Neurological: She is alert and oriented to person, place, and time.  Skin: Skin is warm and dry.  Psychiatric: She has a normal mood and affect. Her speech is normal and behavior is normal. She expresses suicidal ideation. She expresses no homicidal ideation. She expresses no suicidal plans and no homicidal plans.  Nursing note and vitals reviewed.  ED Treatments / Results  DIAGNOSTIC STUDIES:  Oxygen Saturation is 100% on RA, normal by my  interpretation.    COORDINATION OF CARE:  3:07 PM Discussed treatment plan with pt at bedside and pt agreed to plan.  Labs (all labs ordered are listed, but only abnormal results are displayed) Labs Reviewed  COMPREHENSIVE METABOLIC PANEL - Abnormal; Notable for the following:       Result Value   ALT 11 (*)    All other components within normal limits  ACETAMINOPHEN LEVEL - Abnormal; Notable for the following:    Acetaminophen (Tylenol), Serum <10 (*)    All other components within normal limits  ETHANOL  SALICYLATE LEVEL  CBC  RAPID URINE DRUG SCREEN, HOSP PERFORMED  I-STAT BETA HCG BLOOD, ED ([facility], [facility], [facility] ONLY)    EKG  EKG Interpretation None      Radiology No results found.  Procedures Procedures (including critical care time)  Medications Ordered in ED Medications - No data to display   Initial Impression / Assessment and Plan / ED Course  I have reviewed the triage vital signs and the nursing notes.  Pertinent labs & imaging results that were available during my care of the patient were reviewed by me and considered in my medical decision making (see chart for details).   Final Clinical Impressions(s) / ED Diagnoses     {I have reviewed the relevant previous healthcare records.  {I obtained HPI from historian.   ED Course:  Assessment:Pt is a 20yF who presents with suicidal ideation. No plan. Choked herself this AM. Known for "some time." No specific time frame. Pt voluntary. On exam, pt in NAD. Nontoxic/nonseptic appearing. VSS. Afebrile. Lungs CTA. Heart RRR. Abdomen nontender soft.. Labs unremarkable. Pt otherwise medically cleared for psychiatric evaluation. Plan is to consult TTS for further evaluation.   3:53 PM- Consult with TTS recommended outpatient treatment. Pt signed contract. Agrees with follow up as outpatient. See note for details of assessment   Disposition/Plan:  Pending TTS consult Pt acknowledges and agrees with plan  Supervising  Physician Margarita Grizzleanielle Ray, MD  Final diagnoses:  Suicidal ideation    New Prescriptions New Prescriptions   No medications on file   I personally performed the services described in this documentation, which was scribed in my presence. The recorded information has been reviewed and is accurate.    Audry Piliyler Lena Fieldhouse, PA-C 02/17/16 1512    Audry Piliyler Verniece Encarnacion, PA-C 02/17/16 1554    Margarita Grizzleanielle Ray, MD 02/18/16 [phone]

## 2016-02-17 NOTE — BH Assessment (Addendum)
Tele Assessment Note   Sylvia Green is a 21 y.o. female who presents [facility] to [facility] due to depression and SI. Pt shares that she has had SI and depression "on and off since I was a teenager". Pt reports that, lately, she has been depressed all the time. Pt shares that yesterday, she broke down crying and wrapped a phone charger cord around her neck. Pt denies current suicidal intent, HI, or AVH. Pt has no psych hx and is taking no psych meds. Pt would like to receive OP treatment. Clinician spoke to pt's mother, Antony Contras, whom pt lives with for collateral information. Mom indicates that she is comfortable with pt being d/c with OP resources and she does not feel that pt would try to harm herself. She agrees to keep watch of pt to make sure she is safe, until an OP appt can be secured.    Diagnosis: MDD, recurrent episode, moderate  Past Medical History:  Past Medical History:  Diagnosis Date  . Anemia   . Chlamydia infection 06/26/2014  . Gonorrhea     Past Surgical History:  Procedure Laterality Date  . NO PAST SURGERIES      Family History:  Family History  Problem Relation Age of Onset  . Hypertension Mother   . Depression Mother   . Anxiety disorder Mother   . Hyperlipidemia Mother     Social History:  reports that she has never smoked. She has never used smokeless tobacco. She reports that she does not drink alcohol or use drugs.  Additional Social History:  Alcohol / Drug Use Pain Medications: see PTA meds Prescriptions: see PTA meds Over the Counter: see PTA meds History of alcohol / drug use?: No history of alcohol / drug abuse  CIWA: CIWA-Ar BP: 116/73 Pulse Rate: 90 COWS:    PATIENT STRENGTHS: (choose at least two) Average or above average intelligence Capable of independent living Supportive family/friends  Allergies: No Known Allergies  Home Medications:  (Not in a hospital admission)  OB/GYN Status:  Patient's last menstrual period was  01/21/2016 (approximate).  General Assessment Data Location of Assessment: [facility] ED TTS Assessment: In system Is this a Tele or Face-to-Face Assessment?: Tele Assessment Is this an Initial Assessment or a Re-assessment for this encounter?: Initial Assessment Marital status: Single Is patient pregnant?: No Pregnancy Status: No Living Arrangements: Parent, Children Can pt return to current living arrangement?: Yes Admission Status: Voluntary Is patient capable of signing voluntary admission?: Yes Referral Source: Self/Family/Friend Insurance type: Medicaid     Crisis Care Plan Living Arrangements: Parent, Children Name of Psychiatrist: none Name of Therapist: none  Education Status Is patient currently in school?: No  Risk to self with the past 6 months Suicidal Ideation: Yes-Currently Present Has patient been a risk to self within the past 6 months prior to admission? : Yes Suicidal Intent: No Has patient had any suicidal intent within the past 6 months prior to admission? : No Is patient at risk for suicide?: No Suicidal Plan?: No-Not Currently/Within Last 6 Months Has patient had any suicidal plan within the past 6 months prior to admission? : No Access to Means: Yes Specify Access to Suicidal Means: mom agrees to monitor patient What has been your use of drugs/alcohol within the last 12 months?: no use Previous Attempts/Gestures: No Intentional Self Injurious Behavior: None Family Suicide History: Unknown Recent stressful life event(s): Other (Comment) (nothing specified) Persecutory voices/beliefs?: No Depression: Yes Depression Symptoms: Feeling angry/irritable, Feeling worthless/self pity Substance abuse  history and/or treatment for substance abuse?: No Suicide prevention information given to non-admitted patients: Yes  Risk to Others within the past 6 months Homicidal Ideation: No Does patient have any lifetime risk of violence toward others beyond the six months  prior to admission? : No Thoughts of Harm to Others: No Current Homicidal Intent: No Current Homicidal Plan: No Access to Homicidal Means: No History of harm to others?: No Assessment of Violence: None Noted Does patient have access to weapons?: No Criminal Charges Pending?: No Does patient have a court date: No Is patient on probation?: No  Psychosis Hallucinations: None noted Delusions: None noted  Mental Status Report Appearance/Hygiene: Unremarkable Eye Contact: Good Motor Activity: Unremarkable Speech: Logical/coherent Level of Consciousness: Alert Mood: Pleasant Affect: Appropriate to circumstance Anxiety Level: None Thought Processes: Coherent, Relevant Judgement: Partial Orientation: Appropriate for developmental age Obsessive Compulsive Thoughts/Behaviors: None  Cognitive Functioning Concentration: Normal Memory: Recent Intact, Remote Intact IQ: Average Insight: Fair Impulse Control: Fair Appetite: Good Sleep: No Change Vegetative Symptoms: None  ADLScreening [facility]) Patient's cognitive ability adequate to safely complete daily activities?: Yes Patient able to express need for assistance with ADLs?: Yes Independently performs ADLs?: Yes (appropriate for developmental age)  Prior Inpatient Therapy Prior Inpatient Therapy: No  Prior Outpatient Therapy Prior Outpatient Therapy: No Does patient have an ACCT team?: No Does patient have Intensive In-House Services?  : No Does patient have [facility] services? : No Does patient have P4CC services?: No  ADL Screening (condition at time of admission) Patient's cognitive ability adequate to safely complete daily activities?: Yes Is the patient deaf or have difficulty hearing?: No Does the patient have difficulty seeing, even when wearing glasses/contacts?: No Does the patient have difficulty concentrating, remembering, or making decisions?: No Patient able to express need for assistance with  ADLs?: Yes Does the patient have difficulty dressing or bathing?: No Independently performs ADLs?: Yes (appropriate for developmental age) Does the patient have difficulty walking or climbing stairs?: No Weakness of Legs: None Weakness of Arms/Hands: None  Home Assistive Devices/Equipment Home Assistive Devices/Equipment: None    Abuse/Neglect Assessment (Assessment to be complete while patient is alone) Physical Abuse: Denies Verbal Abuse: Denies Sexual Abuse: Denies Exploitation of patient/patient's resources: Denies Self-Neglect: Denies Values / Beliefs Cultural Requests During Hospitalization: None Spiritual Requests During Hospitalization: None Consults Spiritual Care Consult Needed: No Social Work Consult Needed: No Merchant navy officerAdvance Directives (For Healthcare) Does Patient Have a Medical Advance Directive?: No Would patient like information on creating a medical advance directive?: No - Patient declined    Additional Information 1:1 In Past 12 Months?: No CIRT Risk: No Elopement Risk: No Does patient have medical clearance?: Yes     Disposition:  Disposition Initial Assessment Completed for this Encounter: Yes (consulted with Claudette Headonrad Withrow, DNP) Disposition of Patient: Outpatient treatment Type of outpatient treatment: Adult (referrals given for OP therapy and psychiatry)  Laddie AquasSamantha M Alberto Schoch 02/17/2016 3:47 PM

## 2016-02-17 NOTE — ED Notes (Signed)
TTS being performed.  

## 2016-02-17 NOTE — ED Notes (Signed)
[facility] and Arline Aspindy, Charity fundraiserN. Pt noted to be wearing personal coat and has cell phone. Pt gave both items to her mother. Mother sitting in hall while pt is being assessed by Joselyn Glassmanyler, [place]. Discussed w/mother tx plan.

## 2016-02-17 NOTE — ED Triage Notes (Signed)
Pt states "i have suicidal thoughts, I almost tried to choke myself out." Pt denies HI. Pt cooperative at this time.

## 2016-02-17 NOTE — Discharge Instructions (Signed)
Please read and follow all provided instructions.  Your diagnoses today include:  1. Suicidal ideation     Tests performed today include: Vital signs. See below for your results today.   Medications prescribed:  Take as prescribed   Home care instructions:  Follow any educational materials contained in this packet.  Follow-up instructions: Please follow-up with your primary care provider for further evaluation of symptoms and treatment   Return instructions:  Please return to the Emergency Department if you do not get better, if you get worse, or new symptoms OR  - Fever (temperature greater than 101.21F)  - Bleeding that does not stop with holding pressure to the area    -Severe pain (please note that you may be more sore the day after your accident)  - Chest Pain  - Difficulty breathing  - Severe nausea or vomiting  - Inability to tolerate food and liquids  - Passing out  - Skin becoming red around your wounds  - Change in mental status (confusion or lethargy)  - New numbness or weakness    Please return if you have any other emergent concerns.  Additional Information:  Your vital signs today were: BP 116/73 (BP Location: Right Arm)    Pulse 90    Temp 99.2 F (37.3 C) (Oral)    Resp 16    LMP 01/21/2016 (Approximate)    SpO2 100%  If your blood pressure (BP) was elevated above 135/85 this visit, please have this repeated by your doctor within one month. ---------------

## 2016-02-17 NOTE — ED Notes (Signed)
Sitter called for patient 

## 2016-02-17 NOTE — ED Notes (Signed)
Sitter arrived to bedside.  

## 2016-02-17 NOTE — ED Notes (Addendum)
[facility] and signed No Harm Safety Contract - copy given to pt. Pt given belongings and is changing into her personal clothing.

## 2016-02-17 NOTE — ED Notes (Signed)
Mother w/pt - supportive. Both requesting outpt resources for pt.

## 2016-09-13 IMAGING — CT CT ABD-PELV W/ CM
2 of 4 series · 16 of 46 positions shown, 18 images · IV contrast (omnipaque)
Comparison: None.

CLINICAL DATA: Lower abdominal pain radiating to both flanks, with
progression over recent several months.

EXAM:
CT ABDOMEN AND PELVIS WITH CONTRAST
TECHNIQUE: Multidetector CT imaging of the abdomen and pelvis was performed
using the standard protocol following bolus administration of
intravenous contrast.
CONTRAST:  80mL OMNIPAQUE IOHEXOL 300 MG/ML  SOLN

[Series 2: abd/pel with · axial · 0.66mm/px · z∈[-546,-116]mm · 13 of 94 slices shown, 15 images]
[im 4/94  soft-tissue]
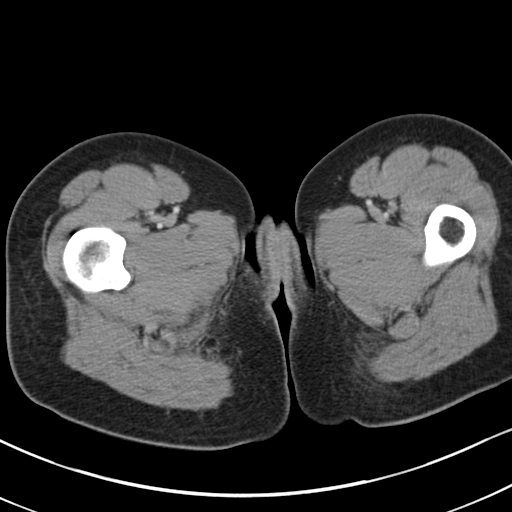
[im 4/94  bone]
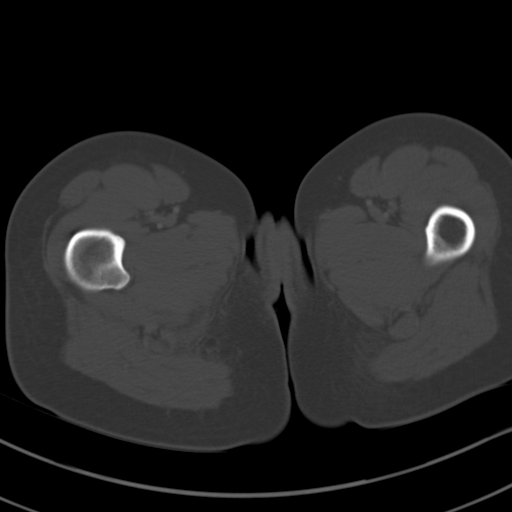
[im 11/94  soft-tissue]
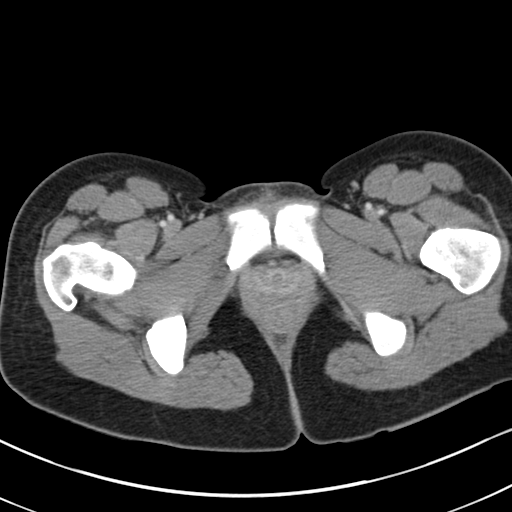
[im 18/94  soft-tissue]
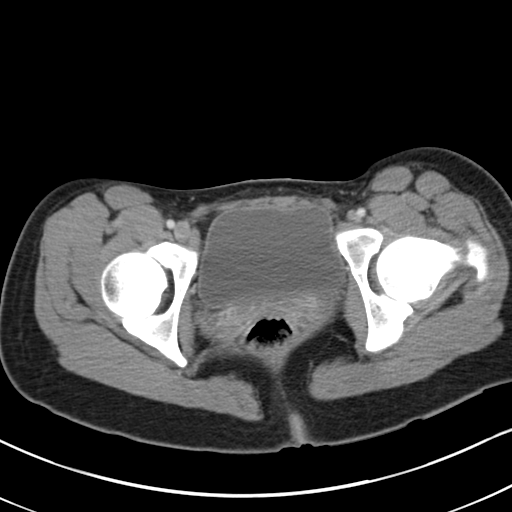
[im 26/94  soft-tissue]
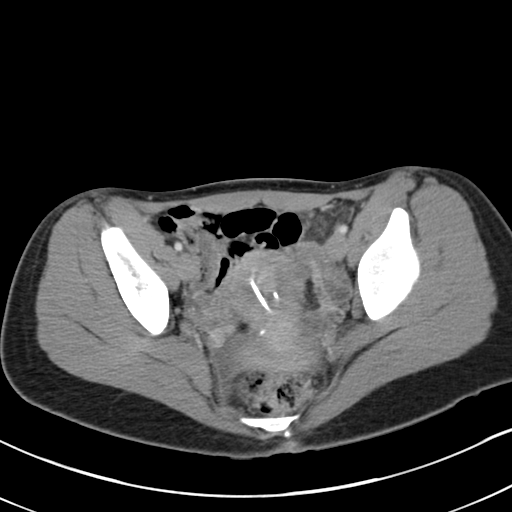
[im 33/94  soft-tissue]
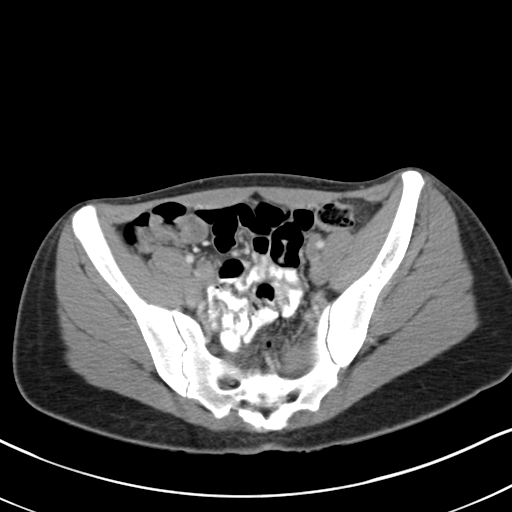
[im 40/94  soft-tissue]
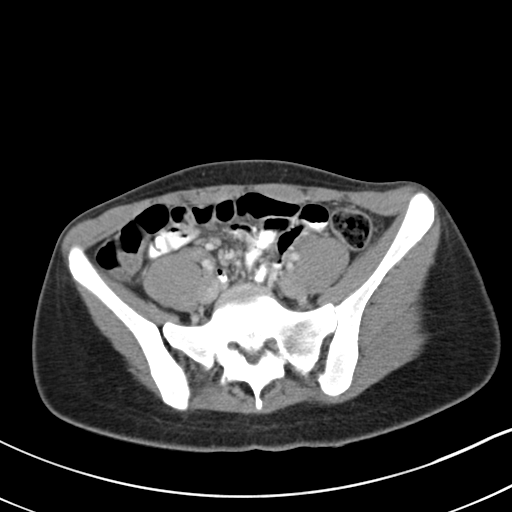
[im 47/94  soft-tissue]
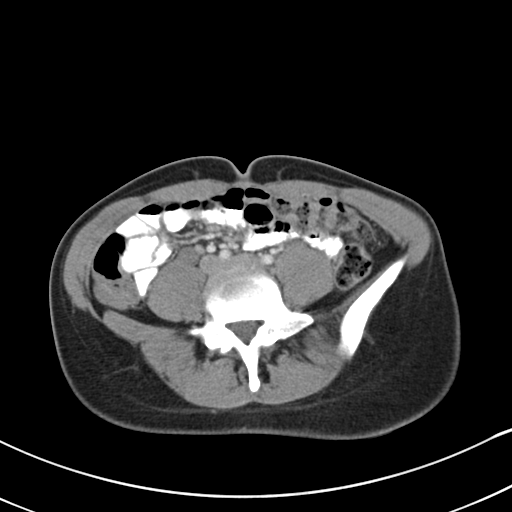
[im 54/94  soft-tissue]
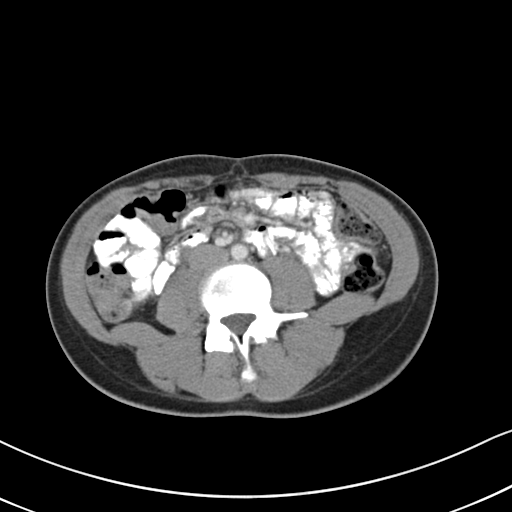
[im 61/94  soft-tissue]
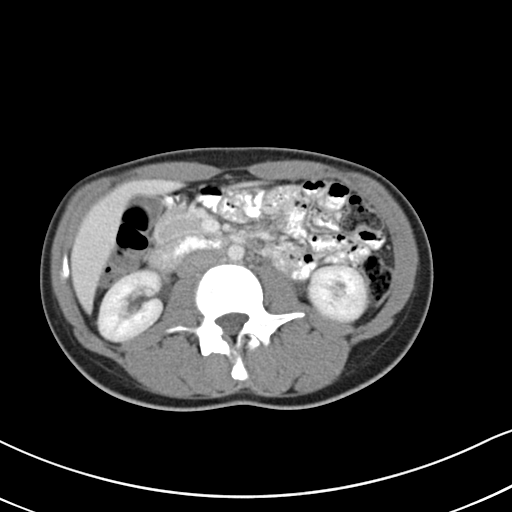
[im 61/94  bone]
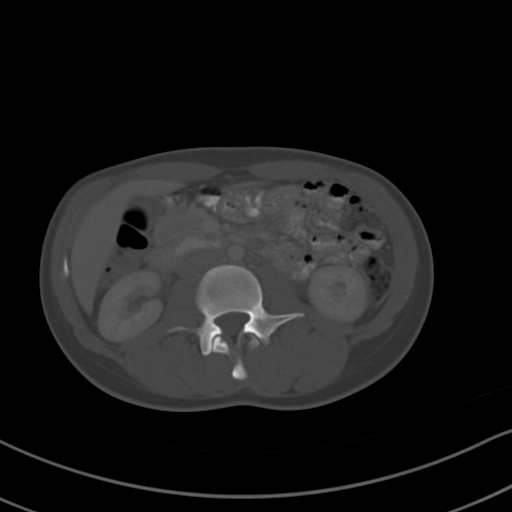
[im 68/94  soft-tissue]
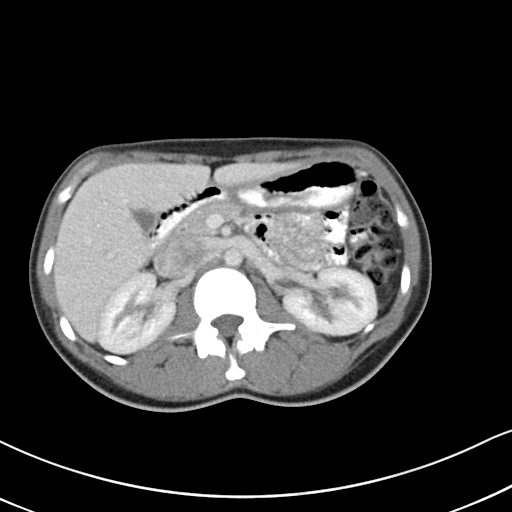
[im 76/94  soft-tissue]
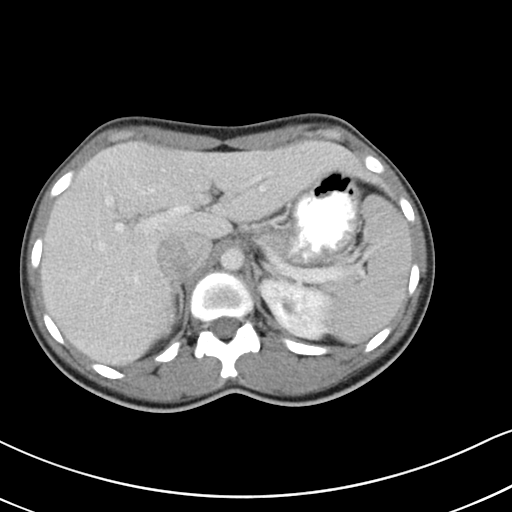
[im 83/94  soft-tissue]
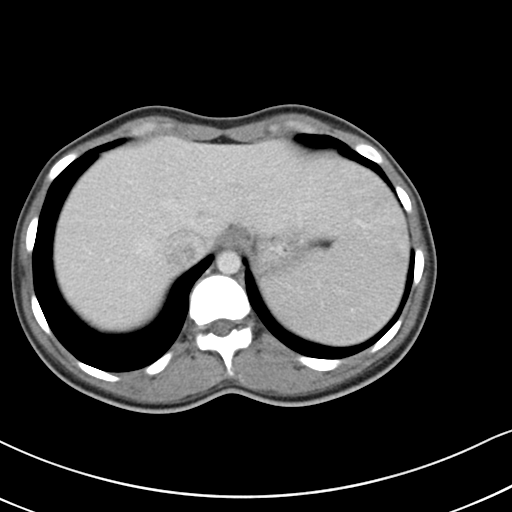
[im 90/94  soft-tissue]
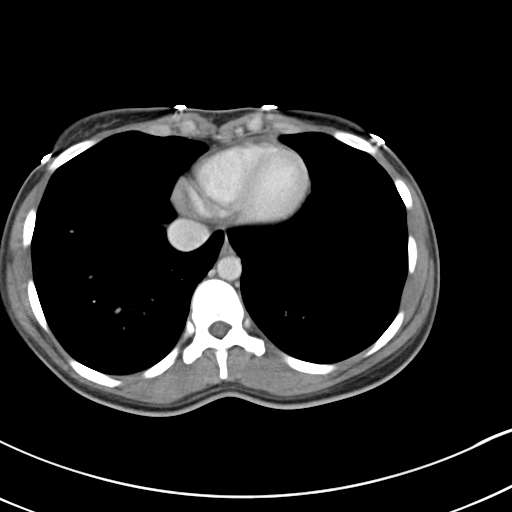

[Series 5: coronal a/|p · coronal · 0.62mm/px · 3 of 70 slices shown]
[im 24/70  soft-tissue]
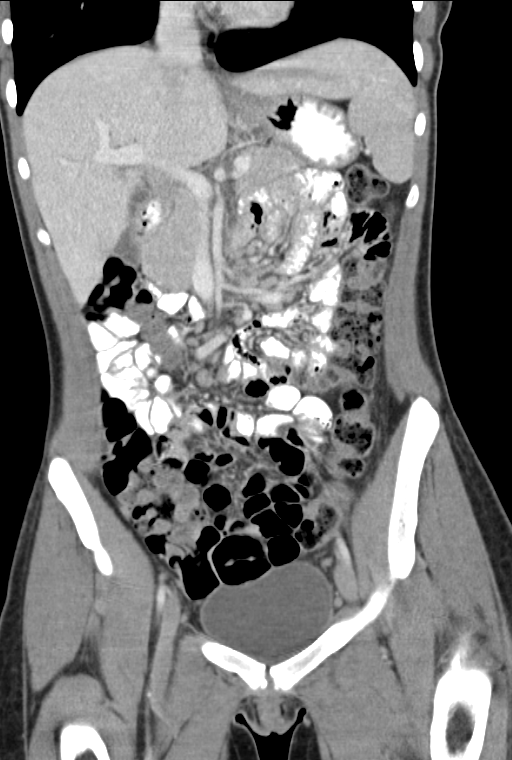
[im 31/70  soft-tissue]
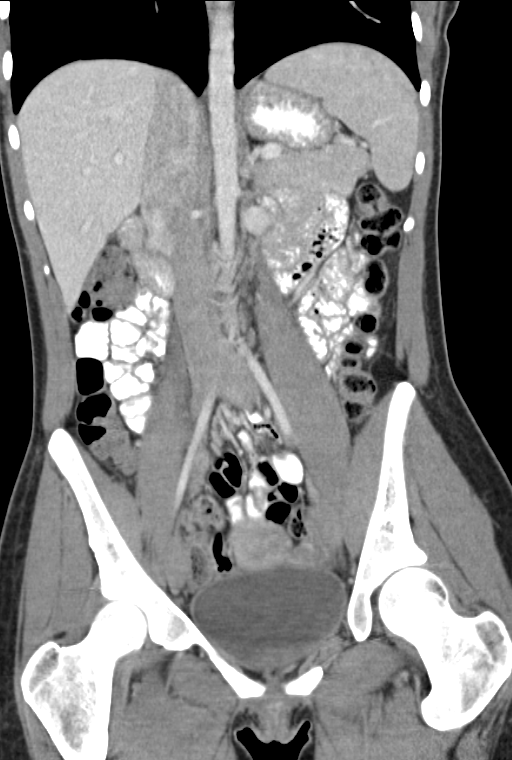
[im 39/70  soft-tissue]
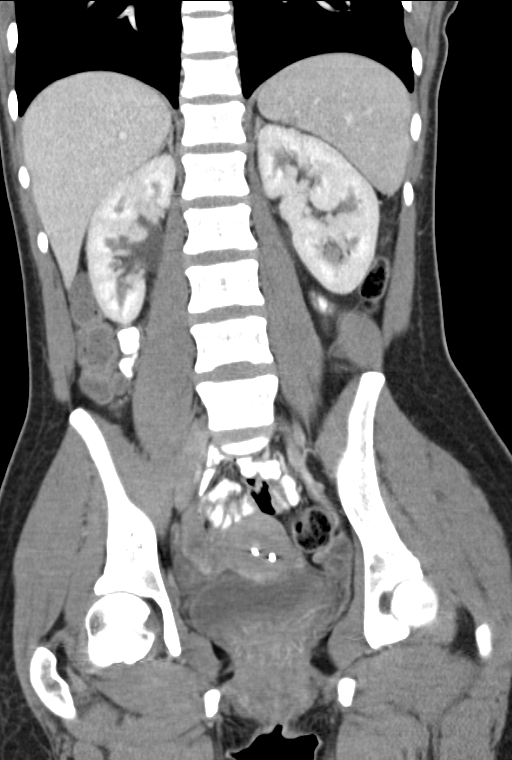

[16 of 46 positions shown; findings below may reference images not displayed]

FINDINGS: Lower Chest:  Unremarkable.

Hepatobiliary: No masses or other significant abnormality
identified. Gallbladder is unremarkable.

Pancreas: No mass, inflammatory changes, or other significant
abnormality identified.

Spleen:  Within normal limits in size and appearance.

Adrenals:  No masses identified.

Kidneys/Urinary Tract:  No evidence of masses or hydronephrosis.

Stomach/Bowel/Peritoneum: No evidence of wall thickening, mass, or
obstruction.

Vascular/Lymphatic: No pathologically enlarged lymph nodes
identified. No other significant abnormality visualized.

Reproductive: IUD is malpositioned upside down within the
endometrial cavity of the uterus. Adnexal regions are unremarkable.

Other:  None.

Musculoskeletal:  No suspicious bone lesions identified.
IMPRESSION: Malpositioned IUD which is up side down within the endometrial
cavity.

No other significant abnormality identified within the abdomen or
pelvis.

## 2017-07-27 ENCOUNTER — Encounter (HOSPITAL_COMMUNITY): Payer: Self-pay | Admitting: Emergency Medicine

## 2017-07-27 ENCOUNTER — Other Ambulatory Visit: Payer: Self-pay

## 2017-07-27 ENCOUNTER — Emergency Department (HOSPITAL_COMMUNITY)
Admission: EM | Admit: 2017-07-27 | Discharge: 2017-07-28 | Disposition: A | Discharge status: Home / Self Care | Payer: Self-pay | Attending: Emergency Medicine | Admitting: Emergency Medicine

## 2017-07-27 DIAGNOSIS — Z5321 Procedure and treatment not carried out due to patient leaving prior to being seen by health care provider: Secondary | ICD-10-CM | POA: Insufficient documentation

## 2017-07-27 DIAGNOSIS — R109 Unspecified abdominal pain: Secondary | ICD-10-CM | POA: Insufficient documentation

## 2017-07-27 NOTE — ED Triage Notes (Signed)
Pt reports stomach cramps that started last night but the pain has increased.  Denies any other symptoms.

## 2017-07-28 ENCOUNTER — Emergency Department (HOSPITAL_COMMUNITY)
Admission: EM | Admit: 2017-07-28 | Discharge: 2017-07-28 | Disposition: A | Discharge status: Home / Self Care | Payer: Self-pay | Attending: Emergency Medicine | Admitting: Emergency Medicine

## 2017-07-28 ENCOUNTER — Other Ambulatory Visit: Payer: Self-pay

## 2017-07-28 ENCOUNTER — Encounter (HOSPITAL_COMMUNITY): Payer: Self-pay | Admitting: Emergency Medicine

## 2017-07-28 DIAGNOSIS — Z79899 Other long term (current) drug therapy: Secondary | ICD-10-CM | POA: Insufficient documentation

## 2017-07-28 DIAGNOSIS — N3001 Acute cystitis with hematuria: Secondary | ICD-10-CM | POA: Insufficient documentation

## 2017-07-28 LAB — URINALYSIS, ROUTINE W REFLEX MICROSCOPIC
Bilirubin Urine: NEGATIVE
Bilirubin Urine: NEGATIVE
GLUCOSE, UA: NEGATIVE mg/dL
Glucose, UA: NEGATIVE mg/dL
Ketones, ur: NEGATIVE mg/dL
Ketones, ur: NEGATIVE mg/dL
NITRITE: POSITIVE — AB
Nitrite: POSITIVE — AB
Protein, ur: 100 mg/dL — AB
Protein, ur: 100 mg/dL — AB
RBC / HPF: >50 RBC/hpf — ABNORMAL HIGH (ref 0–5)
SPECIFIC GRAVITY, URINE: 1.019 (ref 1.005–1.030)
Specific Gravity, Urine: 1.019 (ref 1.005–1.030)
WBC, UA: >50 WBC/hpf — ABNORMAL HIGH (ref 0–5)
pH: 5 (ref 5.0–8.0)
pH: 5 (ref 5.0–8.0)

## 2017-07-28 LAB — I-STAT BETA HCG BLOOD, ED (MC, WL, AP ONLY)

## 2017-07-28 LAB — CBC WITH DIFFERENTIAL/PLATELET
BASOS ABS: 0 10*3/uL (ref 0.0–0.1)
Basophils Relative: 0 %
Eosinophils Absolute: 0.2 10*3/uL (ref 0.0–0.7)
Eosinophils Relative: 2 %
HEMATOCRIT: 39.4 % (ref 36.0–46.0)
HEMOGLOBIN: 13.5 g/dL (ref 12.0–15.0)
LYMPHS ABS: 2.5 10*3/uL (ref 0.7–4.0)
LYMPHS PCT: 20 %
MCH: 30.8 pg (ref 26.0–34.0)
MCHC: 34.3 g/dL (ref 30.0–36.0)
MCV: 90 fL (ref 78.0–100.0)
Monocytes Absolute: 0.7 10*3/uL (ref 0.1–1.0)
Monocytes Relative: 6 %
NEUTROS ABS: 8.7 10*3/uL — AB (ref 1.7–7.7)
NEUTROS PCT: 72 %
Platelets: 221 10*3/uL (ref 150–400)
RBC: 4.38 MIL/uL (ref 3.87–5.11)
RDW: 13.4 % (ref 11.5–15.5)
WBC: 12.2 10*3/uL — ABNORMAL HIGH (ref 4.0–10.5)

## 2017-07-28 LAB — COMPREHENSIVE METABOLIC PANEL
ALK PHOS: 77 U/L (ref 38–126)
ALT: 11 U/L (ref 0–44)
ALT: 12 U/L (ref 0–44)
ANION GAP: 7 (ref 5–15)
AST: 15 U/L (ref 15–41)
AST: 15 U/L (ref 15–41)
Albumin: 4 g/dL (ref 3.5–5.0)
Albumin: 4.2 g/dL (ref 3.5–5.0)
Alkaline Phosphatase: 77 U/L (ref 38–126)
Anion gap: 9 (ref 5–15)
BUN: 10 mg/dL (ref 6–20)
BUN: 13 mg/dL (ref 6–20)
CALCIUM: 9.2 mg/dL (ref 8.9–10.3)
CHLORIDE: 107 mmol/L (ref 98–111)
CO2: 25 mmol/L (ref 22–32)
CO2: 25 mmol/L (ref 22–32)
Calcium: 9.2 mg/dL (ref 8.9–10.3)
Chloride: 106 mmol/L (ref 98–111)
Creatinine, Ser: 0.85 mg/dL (ref 0.44–1.00)
Creatinine, Ser: 1 mg/dL (ref 0.44–1.00)
GFR calc non Af Amer: >60 mL/min (ref >60)
Glucose, Bld: 104 mg/dL — ABNORMAL HIGH (ref 70–99)
Glucose, Bld: 110 mg/dL — ABNORMAL HIGH (ref 70–99)
Potassium: 3.8 mmol/L (ref 3.5–5.1)
Potassium: 3.8 mmol/L (ref 3.5–5.1)
SODIUM: 139 mmol/L (ref 135–145)
Sodium: 140 mmol/L (ref 135–145)
Total Bilirubin: 0.5 mg/dL (ref 0.3–1.2)
Total Bilirubin: 0.5 mg/dL (ref 0.3–1.2)
Total Protein: 7.3 g/dL (ref 6.5–8.1)
Total Protein: 8 g/dL (ref 6.5–8.1)

## 2017-07-28 LAB — CBC
HCT: 38.6 % (ref 36.0–46.0)
HEMOGLOBIN: 12.8 g/dL (ref 12.0–15.0)
MCH: 30.3 pg (ref 26.0–34.0)
MCHC: 33.2 g/dL (ref 30.0–36.0)
MCV: 91.3 fL (ref 78.0–100.0)
Platelets: 193 10*3/uL (ref 150–400)
RBC: 4.23 MIL/uL (ref 3.87–5.11)
RDW: 13.1 % (ref 11.5–15.5)
WBC: 12 10*3/uL — ABNORMAL HIGH (ref 4.0–10.5)

## 2017-07-28 LAB — GC/CHLAMYDIA PROBE AMP (~~LOC~~) NOT AT ARMC
Chlamydia: NEGATIVE
Neisseria Gonorrhea: NEGATIVE

## 2017-07-28 LAB — WET PREP, GENITAL
SPERM: NONE SEEN
TRICH WET PREP: NONE SEEN
Yeast Wet Prep HPF POC: NONE SEEN

## 2017-07-28 LAB — LIPASE, BLOOD: LIPASE: 32 U/L (ref 11–51)

## 2017-07-28 LAB — PREGNANCY, URINE: PREG TEST UR: NEGATIVE

## 2017-07-28 MED ORDER — CEPHALEXIN 500 MG PO CAPS: 500.0000 mg | ORAL_CAPSULE | Freq: Three times a day (TID) | ORAL | 0 refills | Status: AC

## 2017-07-28 MED ORDER — CEPHALEXIN 500 MG PO CAPS
500.0000 mg | ORAL_CAPSULE | Freq: Once | ORAL | Status: AC
  Administered 2017-07-28: 500 mg via ORAL
  Filled 2017-07-28: qty 1

## 2017-07-28 NOTE — ED Provider Notes (Signed)
[facility]-EMERGENCY DEPT Provider Note  CSN: [id number] Arrival date & time: 07/28/17 0102  Chief Complaint(s) Abdominal Pain  HPI Sylvia Green is a 21 y.o. female   The history is provided by the patient.  Abdominal Pain   This is a new problem. The current episode started 2 days ago. The problem occurs constantly. Progression since onset: fluctuating. The pain is associated with an unknown factor. The pain is located in the suprapubic region. The quality of the pain is cramping. The pain is moderate. Pertinent negatives include fever, diarrhea, hematochezia, melena, nausea, vomiting, constipation and dysuria. Nothing aggravates the symptoms. Nothing relieves the symptoms.   LMP: 06/27/2017. Sexually active. Started spotting today. No vaginal discharge.   Past Medical History Past Medical History:  Diagnosis Date  . Anemia   . Chlamydia infection 06/26/2014  . Gonorrhea    Patient Active Problem List   Diagnosis Date Noted  . Chlamydia infection 06/26/2014  . Eczema, dyshidrotic 05/24/2014  . Anemia 10/03/2013  . Acute depression 02/08/2013  . History of gonorrhea 10/19/2010   Home Medication(s) Prior to Admission medications   Medication Sig Start Date End Date Taking? Authorizing Provider  cephALEXin (KEFLEX) 500 MG capsule Take 1 capsule (500 mg total) by mouth 3 (three) times daily for 7 days. 07/28/17 08/04/17  Nira Conn, MD  ferrous sulfate 325 (65 FE) MG tablet Take 1 tablet (325 mg total) by mouth daily with breakfast. Patient not taking: Reported on 05/08/2015 10/03/13   Street, Stephanie Coup, MD  metroNIDAZOLE (FLAGYL) 500 MG tablet Take 1 tablet (500 mg total) by mouth 2 (two) times daily. 07/30/15   Ward, Chase Picket, PA-C                                                                                                                                    Past Surgical History Past Surgical History:  Procedure Laterality Date  . NO  PAST SURGERIES     Family History Family History  Problem Relation Age of Onset  . Hypertension Mother   . Depression Mother   . Anxiety disorder Mother   . Hyperlipidemia Mother     Social History Social History   Tobacco Use  . Smoking status: Never Smoker  . Smokeless tobacco: Never Used  Substance Use Topics  . Alcohol use: No  . Drug use: No   Allergies Patient has no known allergies.  Review of Systems Review of Systems  Constitutional: Negative for fever.  Gastrointestinal: Positive for abdominal pain. Negative for constipation, diarrhea, hematochezia, melena, nausea and vomiting.  Genitourinary: Negative for dysuria.   All other systems are reviewed and are negative for acute change except as noted in the HPI  Physical Exam Vital Signs  I have reviewed the triage vital signs BP 113/73 (BP Location: Left Arm)   Pulse 94   Temp 99.2 F (37.3 C) (Oral)   Resp 16  Ht 5\' 7"  (1.702 m)   Wt 68 kg (150 lb)   SpO2 99%   BMI 23.49 kg/m    Physical Exam  Constitutional: She is oriented to person, place, and time. She appears well-developed and well-nourished. No distress.  HENT:  Head: Normocephalic and atraumatic.  Right Ear: External ear normal.  Left Ear: External ear normal.  Nose: Nose normal.  Eyes: Conjunctivae and EOM are normal. No scleral icterus.  Neck: Normal range of motion and phonation normal.  Cardiovascular: Normal rate and regular rhythm.  Pulmonary/Chest: Effort normal. No stridor. No respiratory distress.  Abdominal: She exhibits no distension. There is tenderness in the suprapubic area and left lower quadrant.  Genitourinary: Uterus is not tender. Cervix exhibits no motion tenderness, no discharge and no friability. Right adnexum displays no mass and no tenderness. Left adnexum displays no mass and no tenderness. There is bleeding in the vagina. No erythema or tenderness in the vagina. No foreign body in the vagina. No signs of injury  around the vagina. No vaginal discharge found.  Genitourinary Comments: Chaperone present during pelvic exam.   Musculoskeletal: Normal range of motion. She exhibits no edema.  Neurological: She is alert and oriented to person, place, and time.  Skin: She is not diaphoretic.  Psychiatric: She has a normal mood and affect. Her behavior is normal.  Vitals reviewed.   ED Results and Treatments Labs (all labs ordered are listed, but only abnormal results are displayed) Labs Reviewed  WET PREP, GENITAL - Abnormal; Notable for the following components:      Result Value   Clue Cells Wet Prep HPF POC PRESENT (*)    WBC, Wet Prep HPF POC FEW (*)    All other components within normal limits  URINALYSIS, ROUTINE W REFLEX MICROSCOPIC - Abnormal; Notable for the following components:   Color, Urine AMBER (*)    APPearance CLOUDY (*)    Hgb urine dipstick LARGE (*)    Protein, ur 100 (*)    Nitrite POSITIVE (*)    Leukocytes, UA MODERATE (*)    RBC / HPF >50 (*)    WBC, UA >50 (*)    Bacteria, UA FEW (*)    Non Squamous Epithelial 0-5 (*)    All other components within normal limits  CBC WITH DIFFERENTIAL/PLATELET - Abnormal; Notable for the following components:   WBC 12.2 (*)    Neutro Abs 8.7 (*)    All other components within normal limits  COMPREHENSIVE METABOLIC PANEL - Abnormal; Notable for the following components:   Glucose, Bld 104 (*)    All other components within normal limits  PREGNANCY, URINE  GC/CHLAMYDIA PROBE AMP ([place]) NOT AT [facility]                                                                                                                         EKG  EKG Interpretation  Date/Time:    Ventricular Rate:    PR Interval:  QRS Duration:   QT Interval:    QTC Calculation:   R Axis:     Text Interpretation:        Radiology No results found. Pertinent labs & imaging results that were available during my care of the patient were reviewed by me and  considered in my medical decision making (see chart for details).  Medications Ordered in ED Medications  cephALEXin (KEFLEX) capsule 500 mg (500 mg Oral Given 07/28/17 0336)                                                                                                                                    Procedures Procedures  (including critical care time)  Medical Decision Making / ED Course I have reviewed the nursing notes for this encounter and the patient's prior records (if available in EHR or on provided paperwork).    Patient is afebrile with stable vital signs, well-appearing, well-hydrated, nontoxic.  Work-up remarkable for urinary tract infection. UPT neg.  No evidence of cervicitis or PID on exam.  GC/Chlamydia culture sent.  Do not feel that empiric treatment is necessary at this time.  Wet prep negative for trichomonas.  Positive for clue cells but likely secondary to menstrual cycle.    Will treat with Keflex for UTI.  The patient appears reasonably screened and/or stabilized for discharge and I doubt any other medical condition or other [facility] requiring further screening, evaluation, or treatment in the ED at this time prior to discharge.  The patient is safe for discharge with strict return precautions.   Final Clinical Impression(s) / ED Diagnoses Final diagnoses:  Acute cystitis with hematuria    Disposition: Discharge  Condition: Good  I have discussed the results, Dx and Tx plan with the patient who expressed understanding and agree(s) with the plan. Discharge instructions discussed at great length. The patient was given strict return precautions who verbalized understanding of the instructions. No further questions at time of discharge.    ED Discharge Orders        Ordered    cephALEXin (KEFLEX) 500 MG capsule  3 times daily     07/28/17 0417       Follow Up: No follow-up provider specified.    This chart was dictated using voice recognition  software.  Despite best efforts to proofread,  errors can occur which can change the documentation meaning.   Nira Conn, MD 07/28/17 [phone]

## 2017-07-28 NOTE — ED Triage Notes (Signed)
Pt from home with c/o sharp lower abdominal pain that began yesterday. Pt states she went to [facility] to be evaluated for this but left due to wait time. Pt denies nausea and denies emesis. Pt rates pain 10/10.

## 2017-07-28 NOTE — ED Notes (Signed)
NS received call [facility]

## 2017-07-28 NOTE — ED Notes (Signed)
No answer in waiting room 

## 2018-01-10 ENCOUNTER — Emergency Department (HOSPITAL_COMMUNITY): Payer: Medicaid Other

## 2018-01-10 ENCOUNTER — Encounter (HOSPITAL_COMMUNITY): Payer: Self-pay | Admitting: Emergency Medicine

## 2018-01-10 ENCOUNTER — Emergency Department (HOSPITAL_COMMUNITY)
Admission: EM | Admit: 2018-01-10 | Discharge: 2018-01-10 | Disposition: A | Discharge status: Home / Self Care | Payer: Medicaid Other | Attending: Emergency Medicine | Admitting: Emergency Medicine

## 2018-01-10 DIAGNOSIS — O26891 Other specified pregnancy related conditions, first trimester: Secondary | ICD-10-CM | POA: Insufficient documentation

## 2018-01-10 DIAGNOSIS — R102 Pelvic and perineal pain: Secondary | ICD-10-CM

## 2018-01-10 DIAGNOSIS — O219 Vomiting of pregnancy, unspecified: Secondary | ICD-10-CM

## 2018-01-10 DIAGNOSIS — Z3A00 Weeks of gestation of pregnancy not specified: Secondary | ICD-10-CM | POA: Insufficient documentation

## 2018-01-10 DIAGNOSIS — Z3201 Encounter for pregnancy test, result positive: Secondary | ICD-10-CM | POA: Diagnosis not present

## 2018-01-10 DIAGNOSIS — O2341 Unspecified infection of urinary tract in pregnancy, first trimester: Secondary | ICD-10-CM | POA: Diagnosis not present

## 2018-01-10 LAB — COMPREHENSIVE METABOLIC PANEL
ALT: 13 U/L (ref 0–44)
AST: 14 U/L — AB (ref 15–41)
Albumin: 4.2 g/dL (ref 3.5–5.0)
Alkaline Phosphatase: 55 U/L (ref 38–126)
Anion gap: 7 (ref 5–15)
BILIRUBIN TOTAL: 0.6 mg/dL (ref 0.3–1.2)
BUN: 10 mg/dL (ref 6–20)
CALCIUM: 9.2 mg/dL (ref 8.9–10.3)
CHLORIDE: 106 mmol/L (ref 98–111)
CO2: 24 mmol/L (ref 22–32)
CREATININE: 0.73 mg/dL (ref 0.44–1.00)
Glucose, Bld: 88 mg/dL (ref 70–99)
Potassium: 3.7 mmol/L (ref 3.5–5.1)
Sodium: 137 mmol/L (ref 135–145)
TOTAL PROTEIN: 7.5 g/dL (ref 6.5–8.1)

## 2018-01-10 LAB — URINALYSIS, ROUTINE W REFLEX MICROSCOPIC
BILIRUBIN URINE: NEGATIVE
GLUCOSE, UA: NEGATIVE mg/dL
Ketones, ur: 5 mg/dL — AB
Nitrite: POSITIVE — AB
Protein, ur: >=300 mg/dL — AB
Specific Gravity, Urine: 1.025 (ref 1.005–1.030)
WBC, UA: >50 WBC/hpf — ABNORMAL HIGH (ref 0–5)
pH: 5 (ref 5.0–8.0)

## 2018-01-10 LAB — I-STAT BETA HCG BLOOD, ED (MC, WL, AP ONLY): I-stat hCG, quantitative: >2000 m[IU]/mL — ABNORMAL HIGH (ref <5)

## 2018-01-10 LAB — CBC
HCT: 41.1 % (ref 36.0–46.0)
Hemoglobin: 13.2 g/dL (ref 12.0–15.0)
MCH: 31 pg (ref 26.0–34.0)
MCHC: 32.1 g/dL (ref 30.0–36.0)
MCV: 96.5 fL (ref 80.0–100.0)
NRBC: 0 % (ref 0.0–0.2)
PLATELETS: 179 10*3/uL (ref 150–400)
RBC: 4.26 MIL/uL (ref 3.87–5.11)
RDW: 13.7 % (ref 11.5–15.5)
WBC: 4.8 10*3/uL (ref 4.0–10.5)

## 2018-01-10 LAB — LIPASE, BLOOD: LIPASE: 32 U/L (ref 11–51)

## 2018-01-10 MED ORDER — PROMETHAZINE HCL 12.5 MG PO TABS: 12.5000 mg | ORAL_TABLET | Freq: Four times a day (QID) | ORAL | 0 refills | Status: DC | PRN

## 2018-01-10 MED ORDER — CEPHALEXIN 500 MG PO CAPS: 500.0000 mg | ORAL_CAPSULE | Freq: Four times a day (QID) | ORAL | 0 refills | Status: DC

## 2018-01-10 NOTE — ED Triage Notes (Signed)
Patient here from home with complaints of abd pain, nausea, vomiting that started yesterday.

## 2018-01-10 NOTE — ED Provider Notes (Addendum)
[facility]-EMERGENCY DEPT Provider Note   CSN: [id number] Arrival date & time: 01/10/18  1224     History   Chief Complaint Chief Complaint  Patient presents with  . Abdominal Pain  . Nausea  . Emesis    HPI Sylvia Green is a [age] y.o. female G3 P2 who presents to the ED with c/o abdominal pain, n/v that started yesterday. Patient reports here periods have been irregular over the past few months. She has had unprotected sex. She has been with her current partner x 5 months. Patient reports she is not concerned about STI's.   The history is provided by the patient. No language interpreter was used.  Abdominal Pain   This is a new problem. The current episode started 2 days ago. The quality of the pain is cramping. The pain is at a severity of 3/10. Associated symptoms include nausea and vomiting. Pertinent negatives include fever, diarrhea, dysuria, frequency and headaches. Nothing relieves the symptoms.  Emesis   Associated symptoms include abdominal pain and cough. Pertinent negatives include no chills, no diarrhea, no fever and no headaches.    Past Medical History:  Diagnosis Date  . Anemia   . Chlamydia infection 06/26/2014  . Gonorrhea     Patient Active Problem List   Diagnosis Date Noted  . Chlamydia infection 06/26/2014  . Eczema, dyshidrotic 05/24/2014  . Anemia 10/03/2013  . Acute depression 02/08/2013  . History of gonorrhea 10/19/2010    Past Surgical History:  Procedure Laterality Date  . NO PAST SURGERIES       OB History    Gravida  2   Para  2   Term  2   Preterm      AB      Living  2     SAB      TAB      Ectopic      Multiple      Live Births  2            Home Medications    Prior to Admission medications   Medication Sig Start Date End Date Taking? Authorizing Provider  cephALEXin (KEFLEX) 500 MG capsule Take 1 capsule (500 mg total) by mouth 4 (four) times daily. 01/10/18   Janne NapoleonNeese, Shawnda Mauney M, NP    promethazine (PHENERGAN) 12.5 MG tablet Take 1 tablet (12.5 mg total) by mouth every 6 (six) hours as needed for nausea or vomiting. 01/10/18   Janne NapoleonNeese, Suriah Peragine M, NP    Family History Family History  Problem Relation Age of Onset  . Hypertension Mother   . Depression Mother   . Anxiety disorder Mother   . Hyperlipidemia Mother     Social History Social History   Tobacco Use  . Smoking status: Never Smoker  . Smokeless tobacco: Never Used  Substance Use Topics  . Alcohol use: No  . Drug use: No     Allergies   Patient has no known allergies.   Review of Systems Review of Systems  Constitutional: Negative for chills and fever.  HENT: Negative for congestion, ear pain and sore throat.   Eyes: Negative for redness and itching.  Respiratory: Positive for cough.   Cardiovascular: Negative for chest pain.  Gastrointestinal: Positive for abdominal pain, nausea and vomiting. Negative for diarrhea.  Genitourinary: Negative for dysuria, frequency, urgency, vaginal bleeding and vaginal discharge.  Musculoskeletal: Negative for back pain and neck stiffness.  Skin: Negative for rash.  Neurological: Negative for light-headedness and  headaches.  Psychiatric/Behavioral: Negative for confusion.     Physical Exam Updated Vital Signs BP 116/65 (BP Location: Right Arm)   Pulse 94   Temp 98.7 F (37.1 C) (Oral)   Resp 18   SpO2 100%   Physical Exam Vitals signs and nursing note reviewed.  Constitutional:      General: She is not in acute distress.    Appearance: She is well-developed.  HENT:     Head: Normocephalic.  Eyes:     Extraocular Movements: Extraocular movements intact.  Neck:     Musculoskeletal: Neck supple.  Cardiovascular:     Rate and Rhythm: Normal rate.  Pulmonary:     Effort: Pulmonary effort is normal.  Abdominal:     Palpations: Abdomen is soft.     Tenderness: There is abdominal tenderness.     Comments: Mildly tender lower abdomen with palpation. No  rebound or guarding.   Genitourinary:    Comments: Patient declined pelvic exam Musculoskeletal: Normal range of motion.  Skin:    General: Skin is warm and dry.  Neurological:     Mental Status: She is alert and oriented to person, place, and time.     Cranial Nerves: No cranial nerve deficit.  Psychiatric:        Mood and Affect: Mood normal.      ED Treatments / Results  Labs (all labs ordered are listed, but only abnormal results are displayed) Labs Reviewed  COMPREHENSIVE METABOLIC PANEL - Abnormal; Notable for the following components:      Result Value   AST 14 (*)    All other components within normal limits  URINALYSIS, ROUTINE W REFLEX MICROSCOPIC - Abnormal; Notable for the following components:   Color, Urine AMBER (*)    APPearance CLOUDY (*)    Hgb urine dipstick SMALL (*)    Ketones, ur 5 (*)    Protein, ur >=300 (*)    Nitrite POSITIVE (*)    Leukocytes, UA MODERATE (*)    WBC, UA >50 (*)    Bacteria, UA FEW (*)    All other components within normal limits  I-STAT BETA HCG BLOOD, ED ([facility], [facility], [facility] ONLY) - Abnormal; Notable for the following components:   I-stat hCG, quantitative >2,000.0 (*)    All other components within normal limits  URINE CULTURE  LIPASE, BLOOD  CBC   Radiology [place] Ob Less Than 14 Weeks With Ob Transvaginal  Result Date: 01/10/2018 CLINICAL DATA:  Pelvic pain in 1st trimester pregnancy. Nausea and vomiting. Unknown LMP. EXAM: OBSTETRIC <14 WK US AND TRANSVAGINAL OB US TECHNIQUE: Both transabdominal and transvaginal ultrasound examinations were performed for complete evaluation of the gestation as well as the maternal uterus, adnexal regions, and pelvic cul-de-sac. Transvaginal technique was performed to assess early pregnancy. COMPARISON:  None. FINDINGS: Intrauterine gestational sac: Single Yolk sac:  Visualized. Embryo:  Visualized. Cardiac Activity: Visualized. Heart Rate: 102 bpm CRL:  5 mm   6 w   1 d                  US EDC:  09/04/2018 Subchorionic hemorrhage:  None visualized. Maternal uterus/adnexae: Normal appearance of both ovaries. No mass or abnormal free fluid identified. IMPRESSION: Single living IUP measuring 6 weeks 1 day, with US EDC of 09/04/2018. No significant maternal uterine or adnexal abnormality identified. Electronically Signed   By: Myles RosenthalJohn  Stahl M.D.   On: 01/10/2018 16:36    Procedures Procedures (including critical care time)  Medications  Ordered in ED Medications - No data to display   Initial Impression / Assessment and Plan / ED Course  I have reviewed the triage vital signs and the nursing notes. Pt has been diagnosed with a UTI in early pregnancy. Pt is afebrile, no CVA tenderness, normotensive. She does have some morning sickness that I will treat with phenergan. Pt to be dc home with antibiotics and instructions to follow up with [facility] to start Prenatal care. Patient to go to [facility] if symptoms worsen. Urine sent for culture.  Final Clinical Impressions(s) / ED Diagnoses   Final diagnoses:  Pelvic pain affecting pregnancy in first trimester, antepartum  UTI in pregnancy, antepartum, first trimester  Nausea and vomiting in pregnancy    ED Discharge Orders         Ordered    cephALEXin (KEFLEX) 500 MG capsule  4 times daily     01/10/18 1646    promethazine (PHENERGAN) 12.5 MG tablet  Every 6 hours PRN     01/10/18 1646           Damian Leavell [place], NP 01/10/18 1651    Kerrie Buffalo [place], NP 01/10/18 1652    Linwood Dibbles, MD 01/11/18 [phone]

## 2018-01-10 NOTE — Discharge Instructions (Signed)
Take tylenol as needed for pain. [facility]. [facility] have problems[facility]

## 2018-01-13 LAB — URINE CULTURE: Culture: >=100000 — AB

## 2018-01-14 ENCOUNTER — Telehealth: Payer: Self-pay

## 2018-01-14 NOTE — Telephone Encounter (Signed)
Post ED Visit - Positive Culture Follow-up  Culture report reviewed by antimicrobial stewardship pharmacist:  []  Enzo BiNathan Batchelder, Pharm.D. []  Celedonio MiyamotoJeremy Frens, Pharm.D., BCPS AQ-ID [x]  Garvin FilaMike Maccia, Pharm.D., BCPS []  Georgina PillionElizabeth Martin, [address] BCPS []  DunstanMinh Pham, [address]Ph[address]D., BCPS, AAHIVP []  Estella HuskMichelle Turner, Pharm.D., BCPS, AAHIVP []  Lysle Pearlachel Rumbarger, PharmD, BCPS []  Phillips Climeshuy Dang, PharmD, BCPS []  Agapito GamesAlison Masters, PharmD, BCPS []  Verlan FriendsErin Deja, PharmD  Positive urine culture Treated with Cephalexin, organism sensitive to the same and no further patient follow-up is required at this time.  Jerry CarasCullom, Danikah Budzik Burnett 01/14/2018, 9:28 AM

## 2018-01-18 NOTE — L&D Delivery Note (Addendum)
Operative Delivery Note At 12:14 PM a viable female was delivered via Vaginal, Spontaneous.  Presentation: vertex; Position: Left,, Occiput,, Anterior; Station: +3  FOllowing delivery of head, anterior shoulder unable to be delivered with gentle traction, turtle sign noted. Delivery of posterior arm accomplished via corkscrew and delivery of posterior arm. Rest of viable female infant delivered without issue.   Total time 45s.   APGARs 7/9, weight pending   Placenta status: deliovered intact within 16m of delivery Cord: 3Vc with the following complications: none Anesthesia:  Epidural Episiotomy: None Lacerations:  none Est. Blood Loss (mL):  133mL  Mom to postpartum.  Baby to Couplet care / Skin to Skin. Patient with satisfied fertility who desires ppBTL. Will touch base with OR to determine if schedule will allow for procedure this evening or tomorrow morning. Timing explained to patient who is aware.   Logan Bores 09/04/2018, 12:29 PM  Dicussed with patient R/B/A of postpartum bilateral tubal ligation. Alternatives includes internal ligation, LARCs. Discussed increased risk of ectopic pregnancy after procedure (1 in 3 pregnancies post-ligation can be ectopic) and that a pregnancy test should be taken if suspicion arises postpartum. Discussed risk of failure 1/100, backup method should be used for 2-4wks. Discussed R/B or surgical procedure including bleeding, infection, injury to surrounding organs. Patient agrees to blood products if needed. Discussed that BTL does not protect against STI and that barrier protection should be used. Patient understands and intends to comply.   Logan Bores 09/04/2018, 12:49 PM

## 2018-02-13 ENCOUNTER — Encounter: Payer: Self-pay | Admitting: Advanced Practice Midwife

## 2018-02-13 LAB — OB RESULTS CONSOLE HEPATITIS B SURFACE ANTIGEN: Hepatitis B Surface Ag: NEGATIVE

## 2018-02-13 LAB — OB RESULTS CONSOLE ANTIBODY SCREEN: Antibody Screen: NEGATIVE

## 2018-02-13 LAB — OB RESULTS CONSOLE ABO/RH: RH Type: POSITIVE

## 2018-02-13 LAB — OB RESULTS CONSOLE RPR: RPR: NONREACTIVE

## 2018-02-13 LAB — OB RESULTS CONSOLE GC/CHLAMYDIA
Chlamydia: NEGATIVE
Gonorrhea: NEGATIVE

## 2018-02-13 LAB — OB RESULTS CONSOLE HIV ANTIBODY (ROUTINE TESTING): HIV: NONREACTIVE

## 2018-02-13 LAB — OB RESULTS CONSOLE RUBELLA ANTIBODY, IGM: Rubella: IMMUNE

## 2018-02-28 LAB — HM PAP SMEAR: HM Pap smear: NORMAL

## 2018-03-07 ENCOUNTER — Encounter (HOSPITAL_COMMUNITY): Payer: Self-pay

## 2018-03-07 ENCOUNTER — Other Ambulatory Visit: Payer: Self-pay

## 2018-03-07 ENCOUNTER — Emergency Department (HOSPITAL_COMMUNITY)
Admission: EM | Admit: 2018-03-07 | Discharge: 2018-03-07 | Disposition: A | Discharge status: Home / Self Care | Payer: Medicaid Other | Attending: Emergency Medicine | Admitting: Emergency Medicine

## 2018-03-07 DIAGNOSIS — O9989 Other specified diseases and conditions complicating pregnancy, childbirth and the puerperium: Secondary | ICD-10-CM | POA: Insufficient documentation

## 2018-03-07 DIAGNOSIS — R220 Localized swelling, mass and lump, head: Secondary | ICD-10-CM | POA: Diagnosis not present

## 2018-03-07 DIAGNOSIS — K0889 Other specified disorders of teeth and supporting structures: Secondary | ICD-10-CM | POA: Diagnosis not present

## 2018-03-07 DIAGNOSIS — Z79899 Other long term (current) drug therapy: Secondary | ICD-10-CM | POA: Insufficient documentation

## 2018-03-07 DIAGNOSIS — Z3A14 14 weeks gestation of pregnancy: Secondary | ICD-10-CM | POA: Insufficient documentation

## 2018-03-07 LAB — CBC WITH DIFFERENTIAL/PLATELET
Abs Immature Granulocytes: 0.06 10*3/uL (ref 0.00–0.07)
Basophils Absolute: 0 10*3/uL (ref 0.0–0.1)
Basophils Relative: 0 %
Eosinophils Absolute: 0 10*3/uL (ref 0.0–0.5)
Eosinophils Relative: 0 %
HCT: 34.4 % — ABNORMAL LOW (ref 36.0–46.0)
Hemoglobin: 11.6 g/dL — ABNORMAL LOW (ref 12.0–15.0)
Immature Granulocytes: 1 %
Lymphocytes Relative: 16 %
Lymphs Abs: 1.9 10*3/uL (ref 0.7–4.0)
MCH: 31.1 pg (ref 26.0–34.0)
MCHC: 33.7 g/dL (ref 30.0–36.0)
MCV: 92.2 fL (ref 80.0–100.0)
MONO ABS: 0.7 10*3/uL (ref 0.1–1.0)
Monocytes Relative: 6 %
Neutro Abs: 9.1 10*3/uL — ABNORMAL HIGH (ref 1.7–7.7)
Neutrophils Relative %: 77 %
Platelets: 163 10*3/uL (ref 150–400)
RBC: 3.73 MIL/uL — ABNORMAL LOW (ref 3.87–5.11)
RDW: 13.6 % (ref 11.5–15.5)
WBC: 11.8 10*3/uL — ABNORMAL HIGH (ref 4.0–10.5)
nRBC: 0 % (ref 0.0–0.2)

## 2018-03-07 LAB — COMPREHENSIVE METABOLIC PANEL
ALBUMIN: 3.6 g/dL (ref 3.5–5.0)
ALT: 8 U/L (ref 0–44)
AST: 12 U/L — ABNORMAL LOW (ref 15–41)
Alkaline Phosphatase: 53 U/L (ref 38–126)
Anion gap: 8 (ref 5–15)
BUN: 6 mg/dL (ref 6–20)
CO2: 19 mmol/L — ABNORMAL LOW (ref 22–32)
Calcium: 8.8 mg/dL — ABNORMAL LOW (ref 8.9–10.3)
Chloride: 106 mmol/L (ref 98–111)
Creatinine, Ser: 0.56 mg/dL (ref 0.44–1.00)
GFR calc Af Amer: >60 mL/min (ref >60)
GFR calc non Af Amer: >60 mL/min (ref >60)
GLUCOSE: 94 mg/dL (ref 70–99)
Potassium: 3.4 mmol/L — ABNORMAL LOW (ref 3.5–5.1)
Sodium: 133 mmol/L — ABNORMAL LOW (ref 135–145)
Total Bilirubin: 0.7 mg/dL (ref 0.3–1.2)
Total Protein: 6.9 g/dL (ref 6.5–8.1)

## 2018-03-07 LAB — POC URINE PREG, ED: Preg Test, Ur: POSITIVE — AB

## 2018-03-07 LAB — HCG, QUANTITATIVE, PREGNANCY: hCG, Beta Chain, Quant, S: 79742 m[IU]/mL — ABNORMAL HIGH (ref <5)

## 2018-03-07 MED ORDER — PENICILLIN V POTASSIUM 500 MG PO TABS: 500.0000 mg | ORAL_TABLET | Freq: Four times a day (QID) | ORAL | 0 refills | Status: AC

## 2018-03-07 MED ORDER — LIDOCAINE VISCOUS HCL 2 % MT SOLN: 15.0000 mL | OROMUCOSAL | 0 refills | Status: DC | PRN

## 2018-03-07 NOTE — ED Triage Notes (Signed)
Pt arrived via [facility] with complaints of left jaw pain that radiates to head. Some swelling noted. Pt currently on antibiotics for a UTI. Denies any other symptoms.

## 2018-03-07 NOTE — Discharge Instructions (Addendum)
Take antibiotics as prescribed.  Take the entire course, even if your symptoms improve. Use Tylenol as needed for pain. You may use viscous lidocaine as needed for breakthrough pain. Follow-up with your primary care doctor/OB/GYN if symptoms are not improving. Return to the emergency room if you develop worsening fevers or symptoms after more than 24 hours of antibiotics, inability to move your eye or pain behind your eye, inability to open your mouth or pain extending into your neck, or any new, worsening, concerning symptoms.

## 2018-03-07 NOTE — ED Notes (Signed)
ED Provider at bedside. 

## 2018-03-07 NOTE — ED Provider Notes (Signed)
[facility]-EMERGENCY DEPT Provider Note   CSN: [id number] Arrival date & time: 03/07/18  0219    History   Chief Complaint Chief Complaint  Patient presents with  . Facial Swelling    Left jaw    HPI Sylvia Green is a 23 y.o. female presenting for evaluation of left jaw pain and swelling.  Patient states she has been having pain since last night.  Pain is of her left cheek and teeth.  While eating last night, she felt something sharp which lasted for half a second before resolving.  She denies any other symptoms.  She was woken from sleep last night with pain of her left jaw.  She has not taken anything for pain including Tylenol or ibuprofen.  Patient is [redacted] weeks pregnant, has had no issues with the pregnancy.  She is on prenatal vitamins, takes no other medications daily.  She denies fevers, chills, sore throat, nasal congestion, cough, shortness of breath.  She denies fall, trauma, or injury.    HPI  Past Medical History:  Diagnosis Date  . Anemia   . Chlamydia infection 06/26/2014  . Gonorrhea     Patient Active Problem List   Diagnosis Date Noted  . Chlamydia infection 06/26/2014  . Eczema, dyshidrotic 05/24/2014  . Anemia 10/03/2013  . Acute depression 02/08/2013  . History of gonorrhea 10/19/2010    Past Surgical History:  Procedure Laterality Date  . NO PAST SURGERIES       OB History    Gravida  3   Para  2   Term  2   Preterm      AB      Living  2     SAB      TAB      Ectopic      Multiple      Live Births  2            Home Medications    Prior to Admission medications   Medication Sig Start Date End Date Taking? Authorizing Provider  Prenatal Vit-Fe Fumarate-FA (PRENATAL MULTIVITAMIN) TABS tablet Take 1 tablet by mouth daily at 12 noon.   Yes [provider]  cephALEXin (KEFLEX) 500 MG capsule Take 1 capsule (500 mg total) by mouth 4 (four) times daily. Patient not taking: Reported on  03/07/2018 01/10/18   Janne Napoleon, NP  lidocaine (XYLOCAINE) 2 % solution Use as directed 15 mLs in the mouth or throat as needed for mouth pain. 03/07/18   Everlie Eble, PA-C  penicillin v potassium (VEETID) 500 MG tablet Take 1 tablet (500 mg total) by mouth 4 (four) times daily for 7 days. 03/07/18 03/14/18  Demonte Dobratz, PA-C  promethazine (PHENERGAN) 12.5 MG tablet Take 1 tablet (12.5 mg total) by mouth every 6 (six) hours as needed for nausea or vomiting. Patient not taking: Reported on 03/07/2018 01/10/18   Janne Napoleon, NP    Family History Family History  Problem Relation Age of Onset  . Hypertension Mother   . Depression Mother   . Anxiety disorder Mother   . Hyperlipidemia Mother     Social History Social History   Tobacco Use  . Smoking status: Never Smoker  . Smokeless tobacco: Never Used  Substance Use Topics  . Alcohol use: No  . Drug use: No     Allergies   Patient has no known allergies.   Review of Systems Review of Systems  HENT: Positive for dental problem and  facial swelling.      Physical Exam Updated Vital Signs BP 102/60   Pulse 98   Temp 99.2 F (37.3 C) (Oral)   Resp 18   SpO2 100%   Physical Exam Vitals signs and nursing note reviewed.  Constitutional:      General: She is not in acute distress.    Appearance: She is well-developed.     Comments: Sitting in the bed in no acute distress  HENT:     Head: Normocephalic and atraumatic.     Comments: Tenderness to palpation of upper and lower teeth, worse on the upper teeth.  Tenderness to palpation of the left cheek, mild swelling.  No erythema.  No tenderness palpation over the TMJ or parotid gland.  No pain under the tongue.  Uvula midline with equal palate rise.  OP clear without tonsillar swelling or exudate.      Mouth/Throat:   Neck:     Musculoskeletal: Normal range of motion.  Pulmonary:     Effort: Pulmonary effort is normal.  Abdominal:     General: There is no  distension.  Musculoskeletal: Normal range of motion.  Skin:    General: Skin is warm.     Findings: No rash.  Neurological:     Mental Status: She is alert and oriented to person, place, and time.      ED Treatments / Results  Labs (all labs ordered are listed, but only abnormal results are displayed) Labs Reviewed  COMPREHENSIVE METABOLIC PANEL - Abnormal; Notable for the following components:      Result Value   Sodium 133 (*)    Potassium 3.4 (*)    CO2 19 (*)    Calcium 8.8 (*)    AST 12 (*)    All other components within normal limits  CBC WITH DIFFERENTIAL/PLATELET - Abnormal; Notable for the following components:   WBC 11.8 (*)    RBC 3.73 (*)    Hemoglobin 11.6 (*)    HCT 34.4 (*)    Neutro Abs 9.1 (*)    All other components within normal limits  HCG, QUANTITATIVE, PREGNANCY - Abnormal; Notable for the following components:   hCG, Beta Chain, Quant, S 30,865 (*)    All other components within normal limits  POC URINE PREG, ED - Abnormal; Notable for the following components:   Preg Test, Ur POSITIVE (*)    All other components within normal limits    EKG None  Radiology No results found.  Procedures Procedures (including critical care time)  Medications Ordered in ED Medications - No data to display   Initial Impression / Assessment and Plan / ED Course  I have reviewed the triage vital signs and the nursing notes.  Pertinent labs & imaging results that were available during my care of the patient were reviewed by me and considered in my medical decision making (see chart for details).        Pt presenting for evaluation of left-sided tooth/facial pain.  Physical exam reassuring, she appears nontoxic.  Patient with temperature of 99.2, high normal.  Mild tachycardia on arrival, likely secondary to fever.  Heart rate improved on my exam.  Patient with focal dental tenderness, no signs of Ludwig's.  Tenderness and swelling is of the cheek, not over  the parotid gland or TMJ.  No sign of strep throat or PTA.  No ear infection.  Likely dental pain with early infection.  Discussed findings with patient.  Discussed treatment with antibiotics  and pain control.  Follow-up with dentistry.  At this time, patient appears safe for discharge.  Return precautions given.  Patient states he understands agrees plan.   Final Clinical Impressions(s) / ED Diagnoses   Final diagnoses:  Facial swelling  Pain, dental    ED Discharge Orders         Ordered    penicillin v potassium (VEETID) 500 MG tablet  4 times daily     03/07/18 0819    lidocaine (XYLOCAINE) 2 % solution  As needed     03/07/18 0819           Alveria ApleyCaccavale, Vaibhav Fogleman, PA-C 03/07/18 1513    Pricilla LovelessGoldston, Scott, MD 03/10/18 1544

## 2018-03-07 NOTE — ED Notes (Signed)
Advised RN Crystal of decreased BP and increased HR.

## 2018-03-08 ENCOUNTER — Emergency Department (HOSPITAL_COMMUNITY)
Admission: EM | Admit: 2018-03-08 | Discharge: 2018-03-09 | Disposition: A | Discharge status: Home / Self Care | Payer: Medicaid Other | Attending: Emergency Medicine | Admitting: Emergency Medicine

## 2018-03-08 DIAGNOSIS — K0889 Other specified disorders of teeth and supporting structures: Secondary | ICD-10-CM | POA: Insufficient documentation

## 2018-03-08 DIAGNOSIS — Z5321 Procedure and treatment not carried out due to patient leaving prior to being seen by health care provider: Secondary | ICD-10-CM | POA: Insufficient documentation

## 2018-03-09 ENCOUNTER — Encounter (HOSPITAL_COMMUNITY): Payer: Self-pay | Admitting: Emergency Medicine

## 2018-03-09 ENCOUNTER — Other Ambulatory Visit: Payer: Self-pay

## 2018-03-09 NOTE — ED Notes (Signed)
Patient not visualized in the lobby 

## 2018-03-09 NOTE — ED Triage Notes (Signed)
Patient arrives with complaints of dental pain that she was evaluated on 2/18 for. Patient states she has been taking abx and using lidocaine but the lido made it worse. Patient states that the pain is now making her eye hurt. Patient states she took some ibuprofen last night with minimal relief.

## 2018-07-10 ENCOUNTER — Telehealth: Payer: Self-pay | Admitting: Hematology

## 2018-07-10 NOTE — Telephone Encounter (Signed)
Received a new hem appt from Dr. Melba Coon for anemia in pregnancy. Pt has been cld and scheduled to see Dr. Irene Limbo on 7/2 at [facility] been made aware to arrive 20 minutes early. Location to our facility provided to the pt.

## 2018-07-20 ENCOUNTER — Telehealth: Payer: Self-pay | Admitting: Hematology

## 2018-07-20 ENCOUNTER — Other Ambulatory Visit: Payer: Medicaid Other

## 2018-07-20 ENCOUNTER — Encounter: Payer: Medicaid Other | Admitting: Hematology

## 2018-07-20 NOTE — Telephone Encounter (Signed)
Pt cld to reschedule appt w/Dr. Irene Limbo to 7/13 at 10am.

## 2018-07-31 ENCOUNTER — Other Ambulatory Visit: Payer: Medicaid Other

## 2018-07-31 ENCOUNTER — Encounter: Payer: Medicaid Other | Admitting: Hematology

## 2018-08-11 LAB — OB RESULTS CONSOLE GBS: GBS: NEGATIVE

## 2018-08-24 ENCOUNTER — Telehealth (HOSPITAL_COMMUNITY): Payer: Self-pay | Admitting: *Deleted

## 2018-08-24 ENCOUNTER — Encounter (HOSPITAL_COMMUNITY): Payer: Self-pay | Admitting: *Deleted

## 2018-08-24 NOTE — Telephone Encounter (Signed)
Preadmission screen  

## 2018-09-02 ENCOUNTER — Other Ambulatory Visit: Payer: Self-pay

## 2018-09-02 ENCOUNTER — Other Ambulatory Visit (HOSPITAL_COMMUNITY)
Admission: RE | Admit: 2018-09-02 | Discharge: 2018-09-02 | Disposition: A | Discharge status: Home / Self Care | Payer: Medicaid Other | Source: Ambulatory Visit | Attending: Obstetrics and Gynecology | Admitting: Obstetrics and Gynecology

## 2018-09-02 DIAGNOSIS — Z01812 Encounter for preprocedural laboratory examination: Secondary | ICD-10-CM | POA: Insufficient documentation

## 2018-09-02 DIAGNOSIS — Z20828 Contact with and (suspected) exposure to other viral communicable diseases: Secondary | ICD-10-CM | POA: Insufficient documentation

## 2018-09-02 LAB — SARS CORONAVIRUS 2 (TAT 6-24 HRS): SARS Coronavirus 2: NEGATIVE

## 2018-09-02 NOTE — MAU Note (Signed)
Pt here for PAT covid swab, denies any symptoms. Swab collected 

## 2018-09-03 ENCOUNTER — Other Ambulatory Visit: Payer: Self-pay | Admitting: Obstetrics and Gynecology

## 2018-09-03 NOTE — H&P (Signed)
Sylvia Green is a 23 y.o. female G3P2002 at 40 weeks (EDD 8/17 20 by 6 week [place] inconsistent with LMP) presenting for IOL at term.  Prenatal care significant for anemia which responded to po iron.  Also some depressive symptoms for which we attempted multiple times to set her up with a counselor.  She declined medications.  She has expressed interest in a postpartum tubal sterilization and signed papers to proceed with that.  OB History    Gravida  3   Para  2   Term  2   Preterm      AB      Living  2     SAB      TAB      Ectopic      Multiple      Live Births  2         2012 NSVD  7# 2013 NSVD 7#  Past Medical History:  Diagnosis Date  . Anemia   . Chlamydia infection 06/26/2014  . Depression   . Gonorrhea    Past Surgical History:  Procedure Laterality Date  . NO PAST SURGERIES     Family History: family history includes Anxiety disorder in her mother; Depression in her mother; Hyperlipidemia in her mother; Hypertension in her mother. Social History:  reports that she has never smoked. She has never used smokeless tobacco. She reports that she does not drink alcohol or use drugs.     Maternal Diabetes: No Genetic Screening: Normal Maternal Ultrasounds/Referrals: Normal Fetal Ultrasounds or other Referrals:  None Maternal Substance Abuse:  No Significant Maternal Medications:  None Significant Maternal Lab Results:  None Other Comments:  None  Review of Systems  Constitutional: Negative for fever.  Eyes: Negative for blurred vision.  Gastrointestinal: Negative for abdominal pain.   Maternal Medical History:  Contractions: Frequency: irregular.   Perceived severity is mild.    Fetal activity: Perceived fetal activity is normal.    Prenatal complications: no prenatal complications Prenatal Complications - Diabetes: none.      Last menstrual period 10/29/2017. Maternal Exam:  Uterine Assessment: Contraction strength is mild.  Contraction  frequency is irregular.   Abdomen: Patient reports no abdominal tenderness. Fetal presentation: vertex  Introitus: Normal vulva. Normal vagina.    Physical Exam  Constitutional: She appears well-developed.  Cardiovascular: Normal rate and regular rhythm.  Respiratory: Effort normal.  GI: Soft.  Genitourinary:    Vulva and vagina normal.   Neurological: She is alert.  Psychiatric: She has a normal mood and affect.    Prenatal labs: ABO, Rh: A/Positive/-- (01/27 0000) Antibody: Negative (01/27 0000) Rubella: Immune (01/27 0000) RPR: Nonreactive (01/27 0000)  HBsAg: Negative (01/27 0000)  HIV: Non-reactive (01/27 0000)  GBS: Negative (07/24 0000)  Essential panel, first trimester screen  and AFP negative Hemoglobin AA  Assessment/Plan: Pt for IOL at term with pitocin.  Desires pp BTL and has signed papers    Logan Bores 09/03/2018, 8:06 PM

## 2018-09-03 NOTE — H&P (Deleted)
  The note originally documented on this encounter has been moved the the encounter in which it belongs.  

## 2018-09-04 ENCOUNTER — Inpatient Hospital Stay (HOSPITAL_COMMUNITY)
Admission: AD | Admit: 2018-09-04 | Discharge: 2018-09-06 | DRG: 797 | Disposition: A | Discharge status: Home / Self Care | Payer: Medicaid Other | Attending: Obstetrics and Gynecology | Admitting: Obstetrics and Gynecology

## 2018-09-04 ENCOUNTER — Encounter (HOSPITAL_COMMUNITY)
Admission: AD | Disposition: A | Discharge status: Home / Self Care | Payer: Self-pay | Source: Home / Self Care | Attending: Obstetrics and Gynecology

## 2018-09-04 ENCOUNTER — Other Ambulatory Visit: Payer: Self-pay

## 2018-09-04 ENCOUNTER — Inpatient Hospital Stay (HOSPITAL_COMMUNITY): Payer: Medicaid Other

## 2018-09-04 ENCOUNTER — Inpatient Hospital Stay (HOSPITAL_COMMUNITY): Payer: Medicaid Other | Admitting: Anesthesiology

## 2018-09-04 ENCOUNTER — Encounter (HOSPITAL_COMMUNITY): Payer: Self-pay | Admitting: *Deleted

## 2018-09-04 DIAGNOSIS — O26893 Other specified pregnancy related conditions, third trimester: Secondary | ICD-10-CM | POA: Diagnosis present

## 2018-09-04 DIAGNOSIS — Z302 Encounter for sterilization: Secondary | ICD-10-CM | POA: Diagnosis not present

## 2018-09-04 DIAGNOSIS — O9081 Anemia of the puerperium: Secondary | ICD-10-CM | POA: Diagnosis not present

## 2018-09-04 DIAGNOSIS — Z3A40 40 weeks gestation of pregnancy: Secondary | ICD-10-CM

## 2018-09-04 DIAGNOSIS — D62 Acute posthemorrhagic anemia: Secondary | ICD-10-CM | POA: Diagnosis not present

## 2018-09-04 HISTORY — PX: TUBAL LIGATION: SHX77

## 2018-09-04 LAB — CBC
HCT: 30.8 % — ABNORMAL LOW (ref 36.0–46.0)
Hemoglobin: 9.9 g/dL — ABNORMAL LOW (ref 12.0–15.0)
MCH: 27.5 pg (ref 26.0–34.0)
MCHC: 32.1 g/dL (ref 30.0–36.0)
MCV: 85.6 fL (ref 80.0–100.0)
Platelets: 249 10*3/uL (ref 150–400)
RBC: 3.6 MIL/uL — ABNORMAL LOW (ref 3.87–5.11)
RDW: 16.2 % — ABNORMAL HIGH (ref 11.5–15.5)
WBC: 10.9 10*3/uL — ABNORMAL HIGH (ref 4.0–10.5)
nRBC: 0 % (ref 0.0–0.2)

## 2018-09-04 LAB — RPR: RPR Ser Ql: NONREACTIVE

## 2018-09-04 LAB — TYPE AND SCREEN
ABO/RH(D): A POS
Antibody Screen: NEGATIVE

## 2018-09-04 LAB — ABO/RH: ABO/RH(D): A POS

## 2018-09-04 SURGERY — LIGATION, FALLOPIAN TUBE, POSTPARTUM
Anesthesia: Epidural | Site: Abdomen | Laterality: Bilateral | Wound class: Clean Contaminated

## 2018-09-04 MED ORDER — LIDOCAINE-EPINEPHRINE (PF) 2 %-1:200000 IJ SOLN
INTRAMUSCULAR | Status: DC | PRN
  Administered 2018-09-04: 2 mL via EPIDURAL
  Administered 2018-09-04: 3 mL via EPIDURAL
  Administered 2018-09-04: 2 mL via EPIDURAL
  Administered 2018-09-04: 5 mL via EPIDURAL

## 2018-09-04 MED ORDER — FENTANYL-BUPIVACAINE-NACL 0.5-0.125-0.9 MG/250ML-% EP SOLN
12.0000 mL/h | EPIDURAL | Status: DC | PRN
  Filled 2018-09-04: qty 250

## 2018-09-04 MED ORDER — LIDOCAINE HCL (PF) 1 % IJ SOLN
INTRAMUSCULAR | Status: DC | PRN
  Administered 2018-09-04 (×2): 4 mL via EPIDURAL

## 2018-09-04 MED ORDER — OXYTOCIN BOLUS FROM INFUSION
500.0000 mL | Freq: Once | INTRAVENOUS | Status: AC
  Administered 2018-09-04: 500 mL via INTRAVENOUS

## 2018-09-04 MED ORDER — OXYTOCIN 40 UNITS IN NORMAL SALINE INFUSION - SIMPLE MED
2.5000 [IU]/h | INTRAVENOUS | Status: DC
  Administered 2018-09-04: 2.5 [IU]/h via INTRAVENOUS
  Filled 2018-09-04: qty 1000

## 2018-09-04 MED ORDER — LACTATED RINGERS IV SOLN: 500.0000 mL | INTRAVENOUS | Status: DC | PRN

## 2018-09-04 MED ORDER — LACTATED RINGERS IV SOLN: 500.0000 mL | Freq: Once | INTRAVENOUS | Status: DC

## 2018-09-04 MED ORDER — ACETAMINOPHEN 325 MG PO TABS: 650.0000 mg | ORAL_TABLET | ORAL | Status: DC | PRN

## 2018-09-04 MED ORDER — OXYCODONE HCL 5 MG PO TABS: 5.0000 mg | ORAL_TABLET | ORAL | Status: DC | PRN

## 2018-09-04 MED ORDER — FENTANYL CITRATE (PF) 100 MCG/2ML IJ SOLN: 25.0000 ug | INTRAMUSCULAR | Status: DC | PRN

## 2018-09-04 MED ORDER — KETOROLAC TROMETHAMINE 30 MG/ML IJ SOLN
INTRAMUSCULAR | Status: AC
  Filled 2018-09-04: qty 1

## 2018-09-04 MED ORDER — IBUPROFEN 600 MG PO TABS
600.0000 mg | ORAL_TABLET | Freq: Four times a day (QID) | ORAL | Status: DC
  Administered 2018-09-05 – 2018-09-06 (×6): 600 mg via ORAL
  Filled 2018-09-04 (×7): qty 1

## 2018-09-04 MED ORDER — ONDANSETRON HCL 4 MG/2ML IJ SOLN: 4.0000 mg | Freq: Four times a day (QID) | INTRAMUSCULAR | Status: DC | PRN

## 2018-09-04 MED ORDER — DIPHENHYDRAMINE HCL 25 MG PO CAPS: 25.0000 mg | ORAL_CAPSULE | Freq: Four times a day (QID) | ORAL | Status: DC | PRN

## 2018-09-04 MED ORDER — ZOLPIDEM TARTRATE 5 MG PO TABS: 5.0000 mg | ORAL_TABLET | Freq: Every evening | ORAL | Status: DC | PRN

## 2018-09-04 MED ORDER — OXYCODONE-ACETAMINOPHEN 5-325 MG PO TABS: 2.0000 | ORAL_TABLET | ORAL | Status: DC | PRN

## 2018-09-04 MED ORDER — SIMETHICONE 80 MG PO CHEW: 80.0000 mg | CHEWABLE_TABLET | ORAL | Status: DC | PRN

## 2018-09-04 MED ORDER — SODIUM CHLORIDE (PF) 0.9 % IJ SOLN
INTRAMUSCULAR | Status: DC | PRN
  Administered 2018-09-04: 12 mL/h via EPIDURAL

## 2018-09-04 MED ORDER — BENZOCAINE-MENTHOL 20-0.5 % EX AERO: 1.0000 "application " | INHALATION_SPRAY | CUTANEOUS | Status: DC | PRN

## 2018-09-04 MED ORDER — COCONUT OIL OIL: 1.0000 "application " | TOPICAL_OIL | Status: DC | PRN

## 2018-09-04 MED ORDER — PHENYLEPHRINE 40 MCG/ML (10ML) SYRINGE FOR IV PUSH (FOR BLOOD PRESSURE SUPPORT)
80.0000 ug | PREFILLED_SYRINGE | INTRAVENOUS | Status: DC | PRN
  Filled 2018-09-04: qty 10

## 2018-09-04 MED ORDER — FENTANYL CITRATE (PF) 100 MCG/2ML IJ SOLN
50.0000 ug | INTRAMUSCULAR | Status: DC | PRN
  Filled 2018-09-04: qty 2

## 2018-09-04 MED ORDER — PHENYLEPHRINE 40 MCG/ML (10ML) SYRINGE FOR IV PUSH (FOR BLOOD PRESSURE SUPPORT): 80.0000 ug | PREFILLED_SYRINGE | INTRAVENOUS | Status: DC | PRN

## 2018-09-04 MED ORDER — DIBUCAINE (PERIANAL) 1 % EX OINT: 1.0000 "application " | TOPICAL_OINTMENT | CUTANEOUS | Status: DC | PRN

## 2018-09-04 MED ORDER — OXYCODONE HCL 5 MG PO TABS: 10.0000 mg | ORAL_TABLET | ORAL | Status: DC | PRN

## 2018-09-04 MED ORDER — PRENATAL MULTIVITAMIN CH
1.0000 | ORAL_TABLET | Freq: Every day | ORAL | Status: DC
  Administered 2018-09-05 – 2018-09-06 (×2): 1 via ORAL
  Filled 2018-09-04 (×2): qty 1

## 2018-09-04 MED ORDER — LIDOCAINE HCL (PF) 1 % IJ SOLN: 30.0000 mL | INTRAMUSCULAR | Status: DC | PRN

## 2018-09-04 MED ORDER — ACETAMINOPHEN 500 MG PO TABS: 1000.0000 mg | ORAL_TABLET | Freq: Once | ORAL | Status: DC

## 2018-09-04 MED ORDER — ONDANSETRON HCL 4 MG PO TABS: 4.0000 mg | ORAL_TABLET | ORAL | Status: DC | PRN

## 2018-09-04 MED ORDER — FENTANYL CITRATE (PF) 100 MCG/2ML IJ SOLN
INTRAMUSCULAR | Status: DC | PRN
  Administered 2018-09-04: 100 ug via EPIDURAL

## 2018-09-04 MED ORDER — DIPHENHYDRAMINE HCL 50 MG/ML IJ SOLN: 12.5000 mg | INTRAMUSCULAR | Status: DC | PRN

## 2018-09-04 MED ORDER — LACTATED RINGERS IV SOLN
INTRAVENOUS | Status: DC
  Administered 2018-09-04: 17:00:00 via INTRAVENOUS

## 2018-09-04 MED ORDER — OXYTOCIN 40 UNITS IN NORMAL SALINE INFUSION - SIMPLE MED
1.0000 m[IU]/min | INTRAVENOUS | Status: DC
  Administered 2018-09-04: 2 m[IU]/min via INTRAVENOUS

## 2018-09-04 MED ORDER — EPHEDRINE 5 MG/ML INJ: 10.0000 mg | INTRAVENOUS | Status: DC | PRN

## 2018-09-04 MED ORDER — ONDANSETRON HCL 4 MG/2ML IJ SOLN: 4.0000 mg | INTRAMUSCULAR | Status: DC | PRN

## 2018-09-04 MED ORDER — TETANUS-DIPHTH-ACELL PERTUSSIS 5-2.5-18.5 LF-MCG/0.5 IM SUSP: 0.5000 mL | Freq: Once | INTRAMUSCULAR | Status: DC

## 2018-09-04 MED ORDER — TERBUTALINE SULFATE 1 MG/ML IJ SOLN: 0.2500 mg | Freq: Once | INTRAMUSCULAR | Status: DC | PRN

## 2018-09-04 MED ORDER — FENTANYL CITRATE (PF) 100 MCG/2ML IJ SOLN: 100.0000 ug | Freq: Once | INTRAMUSCULAR | Status: DC

## 2018-09-04 MED ORDER — BUPIVACAINE HCL (PF) 0.25 % IJ SOLN
INTRAMUSCULAR | Status: DC | PRN
  Administered 2018-09-04: 10 mL

## 2018-09-04 MED ORDER — ACETAMINOPHEN 160 MG/5ML PO SOLN: 1000.0000 mg | Freq: Once | ORAL | Status: DC

## 2018-09-04 MED ORDER — OXYCODONE-ACETAMINOPHEN 5-325 MG PO TABS: 1.0000 | ORAL_TABLET | ORAL | Status: DC | PRN

## 2018-09-04 MED ORDER — PROMETHAZINE HCL 25 MG/ML IJ SOLN: 6.2500 mg | INTRAMUSCULAR | Status: DC | PRN

## 2018-09-04 MED ORDER — BUPIVACAINE HCL (PF) 0.25 % IJ SOLN
INTRAMUSCULAR | Status: AC
  Filled 2018-09-04: qty 20

## 2018-09-04 MED ORDER — SODIUM CHLORIDE 0.9 % IR SOLN
Status: DC | PRN
  Administered 2018-09-04: 1

## 2018-09-04 MED ORDER — SENNOSIDES-DOCUSATE SODIUM 8.6-50 MG PO TABS
2.0000 | ORAL_TABLET | ORAL | Status: DC
  Administered 2018-09-05 (×2): 2 via ORAL
  Filled 2018-09-04 (×2): qty 2

## 2018-09-04 MED ORDER — WITCH HAZEL-GLYCERIN EX PADS: 1.0000 "application " | MEDICATED_PAD | CUTANEOUS | Status: DC | PRN

## 2018-09-04 MED ORDER — KETOROLAC TROMETHAMINE 30 MG/ML IJ SOLN
30.0000 mg | Freq: Once | INTRAMUSCULAR | Status: AC
  Administered 2018-09-04: 30 mg via INTRAVENOUS

## 2018-09-04 SURGICAL SUPPLY — 27 items
CLOTH BEACON ORANGE TIMEOUT ST (SAFETY) ×3 IMPLANT
DRESSING OPSITE X SMALL 2X3 (GAUZE/BANDAGES/DRESSINGS) ×2 IMPLANT
DRSG OPSITE POSTOP 3X4 (GAUZE/BANDAGES/DRESSINGS) ×3 IMPLANT
DURAPREP 26ML APPLICATOR (WOUND CARE) ×6 IMPLANT
GLOVE BIO SURGEON STRL SZ 6.5 (GLOVE) ×2 IMPLANT
GLOVE BIO SURGEONS STRL SZ 6.5 (GLOVE) ×1
GLOVE BIOGEL PI IND STRL 7.0 (GLOVE) ×1 IMPLANT
GLOVE BIOGEL PI INDICATOR 7.0 (GLOVE) ×2
GOWN STRL REUS W/TWL LRG LVL3 (GOWN DISPOSABLE) ×6 IMPLANT
NEEDLE HYPO 22GX1.5 SAFETY (NEEDLE) ×3 IMPLANT
NS IRRIG 1000ML POUR BTL (IV SOLUTION) ×3 IMPLANT
PACK ABDOMINAL MINOR (CUSTOM PROCEDURE TRAY) ×3 IMPLANT
PROTECTOR NERVE ULNAR (MISCELLANEOUS) ×3 IMPLANT
SPONGE LAP 4X18 RFD (DISPOSABLE) IMPLANT
SUT GUT PLAIN 0 CT-3 TAN 27 (SUTURE) IMPLANT
SUT PLAIN 0 NONE (SUTURE) ×3 IMPLANT
SUT VIC AB 0 CT1 27 (SUTURE) ×2
SUT VIC AB 0 CT1 27XBRD ANBCTR (SUTURE) ×1 IMPLANT
SUT VIC AB 2-0 CT1 27 (SUTURE) ×4
SUT VIC AB 2-0 CT1 TAPERPNT 27 (SUTURE) IMPLANT
SUT VIC AB 3-0 PS2 18 (SUTURE) ×2
SUT VIC AB 3-0 PS2 18XBRD (SUTURE) ×1 IMPLANT
SYR CONTROL 10ML LL (SYRINGE) ×3 IMPLANT
TOWEL OR 17X24 6PK STRL BLUE (TOWEL DISPOSABLE) ×6 IMPLANT
TRAY FOLEY CATH SILVER 14FR (SET/KITS/TRAYS/PACK) ×3 IMPLANT
WATER STERILE IRR 1000ML POUR (IV SOLUTION) ×3 IMPLANT
YANKAUER SUCT BULB TIP NO VENT (SUCTIONS) ×2 IMPLANT

## 2018-09-04 NOTE — Anesthesia Preprocedure Evaluation (Signed)
Anesthesia Evaluation  Patient identified by MRN, date of birth, ID band Patient awake    Reviewed: Allergy & Precautions, Patient's Chart, lab work & pertinent test results  History of Anesthesia Complications Negative for: history of anesthetic complications  Airway Mallampati: II  TM Distance: >3 FB Neck ROM: Full    Dental no notable dental hx.    Pulmonary neg pulmonary ROS,    Pulmonary exam normal        Cardiovascular negative cardio ROS Normal cardiovascular exam     Neuro/Psych Depression negative neurological ROS     GI/Hepatic negative GI ROS, Neg liver ROS,   Endo/Other  negative endocrine ROS  Renal/GU negative Renal ROS     Musculoskeletal negative musculoskeletal ROS (+)   Abdominal   Peds  Hematology  (+) anemia , Hgb 9.9   Anesthesia Other Findings Day of surgery medications reviewed with the patient.  Reproductive/Obstetrics (+) Pregnancy                             Anesthesia Physical Anesthesia Plan  ASA: II  Anesthesia Plan: Epidural   Post-op Pain Management:    Induction:   PONV Risk Score and Plan: Treatment may vary due to age or medical condition  Airway Management Planned: Natural Airway  Additional Equipment:   Intra-op Plan:   Post-operative Plan:   Informed Consent: I have reviewed the patients History and Physical, chart, labs and discussed the procedure including the risks, benefits and alternatives for the proposed anesthesia with the patient or authorized representative who has indicated his/her understanding and acceptance.       Plan Discussed with:   Anesthesia Plan Comments:         Anesthesia Quick Evaluation

## 2018-09-04 NOTE — Anesthesia Procedure Notes (Signed)
Epidural Patient location during procedure: OB Start time: 09/04/2018 10:07 AM End time: 09/04/2018 10:10 AM  Staffing Anesthesiologist: Brennan Bailey, MD Performed: anesthesiologist   Preanesthetic Checklist Completed: patient identified, pre-op evaluation, timeout performed, IV checked, risks and benefits discussed and monitors and equipment checked  Epidural Patient position: sitting Prep: site prepped and draped and DuraPrep Patient monitoring: continuous pulse ox, blood pressure and heart rate Approach: midline Location: L3-L4 Injection technique: LOR air  Needle:  Needle type: Tuohy  Needle gauge: 17 G Needle length: 9 cm Needle insertion depth: 5 cm Catheter type: closed end flexible Catheter size: 19 Gauge Catheter at skin depth: 10 cm Test dose: negative and Other (1% lidocaine)  Assessment Events: blood not aspirated, injection not painful, no injection resistance, negative IV test and no paresthesia  Additional Notes Patient identified. Risks, benefits, and alternatives discussed with patient including but not limited to bleeding, infection, nerve damage, paralysis, failed block, incomplete pain control, headache, blood pressure changes, nausea, vomiting, reactions to medication, itching, and postpartum back pain. Confirmed with bedside nurse the patient's most recent platelet count. Confirmed with patient that they are not currently taking any anticoagulation, have any bleeding history, or any family history of bleeding disorders. Patient expressed understanding and wished to proceed. All questions were answered. Sterile technique was used throughout the entire procedure. Please see nursing notes for vital signs.   Crisp LOR on first pass. Test dose was given through epidural catheter and negative prior to continuing to dose epidural or start infusion. Warning signs of high block given to the patient including shortness of breath, tingling/numbness in hands, complete  motor block, or any concerning symptoms with instructions to call for help. Patient was given instructions on fall risk and not to get out of bed. All questions and concerns addressed with instructions to call with any issues or inadequate analgesia.  Reason for block:procedure for pain

## 2018-09-04 NOTE — Transfer of Care (Signed)
Immediate Anesthesia Transfer of Care Note  Patient: Sylvia Green  Procedure(s) Performed: POST PARTUM TUBAL LIGATION (Bilateral Abdomen)  Patient Location: PACU  Anesthesia Type:Epidural  Level of Consciousness: awake, alert , oriented and patient cooperative  Airway & Oxygen Therapy: Patient Spontanous Breathing  Post-op Assessment: Report given to RN and Post -op Vital signs reviewed and stable  Post vital signs: Reviewed and stable  Last Vitals:  Vitals Value Taken Time  BP 113/68 09/04/18 1748  Temp    Pulse    Resp    SpO2    Vitals shown include unvalidated device data.  Last Pain:  Vitals:   09/04/18 1431  TempSrc:   PainSc: Asleep         Complications: No apparent anesthesia complications

## 2018-09-04 NOTE — Anesthesia Preprocedure Evaluation (Signed)
Anesthesia Evaluation  Patient identified by MRN, date of birth, ID band Patient awake    Reviewed: Allergy & Precautions, NPO status , Patient's Chart, lab work & pertinent test results  History of Anesthesia Complications Negative for: history of anesthetic complications  Airway Mallampati: II  TM Distance: >3 FB Neck ROM: Full    Dental no notable dental hx.    Pulmonary neg pulmonary ROS,    Pulmonary exam normal        Cardiovascular negative cardio ROS Normal cardiovascular exam     Neuro/Psych Depression negative neurological ROS     GI/Hepatic negative GI ROS, Neg liver ROS,   Endo/Other  negative endocrine ROS  Renal/GU negative Renal ROS     Musculoskeletal negative musculoskeletal ROS (+)   Abdominal   Peds  Hematology  (+) anemia , Hgb 9.9   Anesthesia Other Findings Day of surgery medications reviewed with the patient.  Reproductive/Obstetrics Postpartum, desires sterilization                             Anesthesia Physical Anesthesia Plan  ASA: II  Anesthesia Plan: Epidural   Post-op Pain Management:    Induction:   PONV Risk Score and Plan: 3 and Treatment may vary due to age or medical condition, Ondansetron and Dexamethasone  Airway Management Planned: Natural Airway  Additional Equipment:   Intra-op Plan:   Post-operative Plan:   Informed Consent: I have reviewed the patients History and Physical, chart, labs and discussed the procedure including the risks, benefits and alternatives for the proposed anesthesia with the patient or authorized representative who has indicated his/her understanding and acceptance.       Plan Discussed with: CRNA  Anesthesia Plan Comments:         Anesthesia Quick Evaluation

## 2018-09-04 NOTE — Op Note (Signed)
Operative Note    Preoperative Diagnosis Desires permanent sterilization  Postoperative Diagnosis Desires permanent sterilization  Procedure Tubal sterilization with bilateral distal salpingectomies  Surgeon Paula Compton, MD  Anesthesia Epidural  Fluids: EBL less than 53mL UOP 139ml in foley IVF 918mlLR  Findings Normal pelvic anatomy  Specimen Distal bilateral tubal segments  Procedure Note Patient was taken to the operating room where epidural anesthesia was boosted and found to be adequate by allys clamp test.  She was then prepped and draped in the normal sterile fashion in the dorsal supine position. An appropriate time out was performed. . Attention was then turned to the abdomen where of infraumbilical incision was made after injection with quarter percent Marcaine approximate 2 cm in width. The fascia was then identified and grasped with Coker clamps and elevated. The fascia was then opened sharply with Mayo scissors and the peritoneal space entered bluntly. [employer]/Nav[employer] retractors were utilized to identify the fallopian tubes bilaterally and trace it to its fimbriated end.  The tube was elevated out of the incision and a Kelly clamp placed across the distal end.  The distal end was amputed bilterally and handed off to pathology.  Each tubal pedicle was secured with suture ligature of 2-0 vicryl x 2.  A second look at each tube confirmed hemostasis.  All instruments and sponges were removed and the fascia closed with a running suture of 0-vicryl.  The skin was closed with 3-0 vicryl in a subcuticular stitch.   All counts were correct and the patient taken to the recovery room in good condition.

## 2018-09-04 NOTE — Anesthesia Postprocedure Evaluation (Signed)
Anesthesia Post Note  Patient: Sylvia Green  Procedure(s) Performed: POST PARTUM TUBAL LIGATION (Bilateral Abdomen)     Patient location during evaluation: PACU Anesthesia Type: Epidural Level of consciousness: awake and alert and oriented Pain management: pain level controlled Vital Signs Assessment: post-procedure vital signs reviewed and stable Respiratory status: spontaneous breathing, nonlabored ventilation and respiratory function stable Cardiovascular status: blood pressure returned to baseline Postop Assessment: no apparent nausea or vomiting and epidural receding Anesthetic complications: no    Last Vitals:  Vitals:   09/04/18 1845 09/04/18 1927  BP: 103/71 103/64  Pulse: 68 66  Resp: 13 18  Temp: 36.4 C 36.4 C  SpO2: 100% 100%    Last Pain:  Vitals:   09/04/18 1927  TempSrc: Oral  PainSc: [place]

## 2018-09-05 LAB — CBC
HCT: 30.1 % — ABNORMAL LOW (ref 36.0–46.0)
Hemoglobin: 9.7 g/dL — ABNORMAL LOW (ref 12.0–15.0)
MCH: 27.6 pg (ref 26.0–34.0)
MCHC: 32.2 g/dL (ref 30.0–36.0)
MCV: 85.5 fL (ref 80.0–100.0)
Platelets: 250 10*3/uL (ref 150–400)
RBC: 3.52 MIL/uL — ABNORMAL LOW (ref 3.87–5.11)
RDW: 16.3 % — ABNORMAL HIGH (ref 11.5–15.5)
WBC: 11.5 10*3/uL — ABNORMAL HIGH (ref 4.0–10.5)
nRBC: 0 % (ref 0.0–0.2)

## 2018-09-05 NOTE — Progress Notes (Signed)
CSW received consult for hx of Anxiety.  CSW met with MOB to offer support and complete assessment.    CSW  Congratulated MOB and FOB on the birth of infant. CSW advised MOB of the HIPPA policy and he expressed that FOB could remain in the room. CSW advised MOB of the reason for the visit. MOB reported that's he was diagnosed with anxiety some years ago. MOB reported that she was placed on medications and in therapy. MOB expressed that she is no longer in therapy or taking medications. MOB denies feeling SI or HI at this time.  MOB expressed that she has all needed items to care for infant with no further needs at this time.   CSW provided education regarding the baby blues period vs. perinatal mood disorders, discussed treatment and gave resources for mental health follow up if concerns arise.  CSW recommends self-evaluation during the postpartum time period using the New Mom Checklist from Postpartum Progress and encouraged MOB to contact a medical professional if symptoms are noted at any time.   CSW provided review of Sudden Infant Death Syndrome (SIDS) precautions.   CSW identifies no further need for intervention and no barriers to discharge at this time.      Kamren Heintzelman S. Jennifer Payes, MSW, LCSW [facility] [phone]   

## 2018-09-05 NOTE — Anesthesia Postprocedure Evaluation (Signed)
Anesthesia Post Note  Patient: Sylvia Green  Procedure(s) Performed: AN AD [place]     Patient location during evaluation: Mother Baby Anesthesia Type: Epidural Level of consciousness: awake and alert Pain management: pain level controlled Vital Signs Assessment: post-procedure vital signs reviewed and stable Respiratory status: spontaneous breathing, nonlabored ventilation and respiratory function stable Cardiovascular status: stable Postop Assessment: no headache, no backache and epidural receding Anesthetic complications: no    Last Vitals:  Vitals:   09/05/18 0042 09/05/18 0500  BP: 114/73 118/73  Pulse: 88 67  Resp: 20 20  Temp: 37.2 C 37 C  SpO2: 100% 100%    Last Pain:  Vitals:   09/05/18 0500  TempSrc: Oral  PainSc: 0-No pain   Pain Goal:                   Sylvia Green

## 2018-09-05 NOTE — Lactation Note (Signed)
This note was copied from a baby's chart. Lactation Consultation Note Baby 68 hrs old. Mom is breast/formula feeding.  Mom didn't BF her other 2 children. Mom has everted nipples.  Hand expression demonstrated w/clostrum pouring out from breast. Praised mom. Discussed feeding positions, placed baby in football hold. Baby gulping at the breast. Newborn behavior, STS, I&O, feeding habits, supplementing, supply and demand discussed. Encouraged to call for assistance or questions. Lactation brochure given.  Patient Name: Sylvia Green [id number] Date: 09/05/2018 Reason for consult: Initial assessment;1st time breastfeeding;Term   Maternal Data Has patient been taught Hand Expression?: Yes Does the patient have breastfeeding experience prior to this delivery?: No  Feeding Feeding Type: Breast Fed  LATCH Score Latch: Grasps breast easily, tongue down, lips flanged, rhythmical sucking.  Audible Swallowing: Spontaneous and intermittent  Type of Nipple: Everted at rest and after stimulation  Comfort (Breast/Nipple): Soft / non-tender  Hold (Positioning): Assistance needed to correctly position infant at breast and maintain latch.  LATCH Score: 9  Interventions Interventions: Breast feeding basics reviewed;Adjust position;Assisted with latch;Support pillows;Skin to skin;Position options;Breast massage;Hand express;Breast compression  Lactation Tools Discussed/Used WIC Program: Yes   Consult Status Consult Status: Follow-up Date: 09/06/18 Follow-up type: In-patient    Damare Serano, Elta Gu[place] 09/05/2018, 6:00 AM

## 2018-09-05 NOTE — Progress Notes (Signed)
Post Partum Day 1/POD#1 PP BTL Subjective: no complaints, up ad lib, voiding, tolerating PO and pain controlled, nl lochia  Objective: Blood pressure 118/73, pulse 67, temperature 98.6 F (37 C), temperature source Oral, resp. rate 20, height 5\' 7"  (1.702 m), weight 87.1 kg, last menstrual period 10/29/2017, SpO2 100 %, unknown if currently breastfeeding.  Physical Exam:  General: alert and no distress Lochia: appropriate Uterine Fundus: firm Inc C/D/I  Recent Labs    09/04/18 0800 09/05/18 0602  HGB 9.9* 9.7*  HCT 30.8* 30.1*    Assessment/Plan: Plan for discharge tomorrow, Breastfeeding and Lactation consult.  Tolerating blood loss anemia.  Desires d/c in AM   LOS: 1 day   Sylvia Green 09/05/2018, 8:21 AM

## 2018-09-06 ENCOUNTER — Encounter (HOSPITAL_COMMUNITY): Payer: Self-pay | Admitting: Obstetrics and Gynecology

## 2018-09-06 MED ORDER — IBUPROFEN 600 MG PO TABS: 600.0000 mg | ORAL_TABLET | Freq: Four times a day (QID) | ORAL | 1 refills | Status: DC | PRN

## 2018-09-06 MED ORDER — OXYCODONE HCL 5 MG PO TABS: 5.0000 mg | ORAL_TABLET | Freq: Four times a day (QID) | ORAL | 0 refills | Status: AC | PRN

## 2018-09-06 NOTE — Lactation Note (Signed)
This note was copied from a baby's chart. Lactation Consultation Note  Patient Name: Sylvia Green [id number] Date: 09/06/2018 Reason for consult: Follow-up assessment   Baby 32 hours old and sleeping under phototherapy. Mother states baby recently bf for 30 min. Mother is primarily breastfeeding but is supplementing with 50-60 ml of formula (about every other feeding). Suggest mother call [facility] or RN to view next breastfeeding session. Feed on demand approximately 8-12 times per day.  Wake baby for feeding if needed.     Maternal Data    Feeding Feeding Type: Breast Fed  LATCH Score                   Interventions Interventions: Breast feeding basics reviewed  Lactation Tools Discussed/Used     Consult Status Consult Status: Follow-up Date: 09/06/18 Follow-up type: In-patient    Vivianne Master [facility] 09/06/2018, 1:36 PM

## 2018-09-06 NOTE — Discharge Summary (Signed)
OB Discharge Summary     Patient Name: Sylvia Green DOB: [birth year] MRN: [id number]  Date of admission: 09/04/2018 Delivering MD: Paula Compton   Date of discharge: 09/06/2018  Admitting diagnosis: INDUCTION Intrauterine pregnancy: [redacted]w[redacted]d     Secondary diagnosis:  Active Problems:   Indication for care in labor and delivery, antepartum   NSVD (normal spontaneous vaginal delivery)  Additional problems: none     Discharge diagnosis: Term Pregnancy Delivered                                                                                                Post partum procedures:postpartum tubal ligation  Augmentation: Pitocin  Complications: None  Hospital course:  Induction of Labor With Vaginal Delivery   23 y.o. yo G3P3003 at [redacted]w[redacted]d was admitted to the hospital 09/04/2018 for induction of labor.  Indication for induction: Favorable cervix at term.  Patient had an uncomplicated labor course as follows: Membrane Rupture Time/Date: 10:52 AM ,09/04/2018   Intrapartum Procedures: Episiotomy: None [1]                                         Lacerations:  None [1]  Patient had delivery of a Viable infant.  Information for the patient's newborn:  Soleia, Badolato [[id number]]      09/04/2018  Details of delivery can be found in separate delivery note.  Patient had a routine postpartum course. Patient is discharged home 09/06/18.  Physical exam  Vitals:   09/05/18 1410 09/05/18 1815 09/05/18 2224 09/06/18 0540  BP: 106/69 110/63 108/68 101/60  Pulse: 80 74 79 81  Resp: 18 18 18 18   Temp: 98.2 F (36.8 C) 98.6 F (37 C) 97.8 F (36.6 C) 99.1 F (37.3 C)  TempSrc: Oral Oral Oral Oral  SpO2:    100%  Weight:      Height:       General: alert, cooperative and no distress Lochia: appropriate Uterine Fundus: firm Incision: Dressing is clean, dry, and intact DVT Evaluation: No evidence of DVT seen on physical exam. Labs: Lab Results  Component Value Date   WBC 11.5 (H)  09/05/2018   HGB 9.7 (L) 09/05/2018   HCT 30.1 (L) 09/05/2018   MCV 85.5 09/05/2018   PLT 250 09/05/2018   CMP Latest Ref Rng & Units 03/07/2018  Glucose 70 - 99 mg/dL 94  BUN 6 - 20 mg/dL 6  Creatinine 0.44 - 1.00 mg/dL 0.56  Sodium 135 - 145 mmol/L 133(L)  Potassium 3.5 - 5.1 mmol/L 3.4(L)  Chloride 98 - 111 mmol/L 106  CO2 22 - 32 mmol/L 19(L)  Calcium 8.9 - 10.3 mg/dL 8.8(L)  Total Protein 6.5 - 8.1 g/dL 6.9  Total Bilirubin 0.3 - 1.2 mg/dL 0.7  Alkaline Phos 38 - 126 U/L 53  AST 15 - 41 U/L 12(L)  ALT 0 - 44 U/L 8    Discharge instruction: per After Visit Summary and "Baby and Me Booklet".  After visit meds:  Allergies as of  09/06/2018   No Known Allergies     Medication List    STOP taking these medications   cephALEXin 500 MG capsule Commonly known as: KEFLEX     TAKE these medications   ibuprofen 600 MG tablet Commonly known as: ADVIL Take 1 tablet (600 mg total) by mouth every 6 (six) hours as needed for moderate pain or cramping.   lidocaine 2 % solution Commonly known as: XYLOCAINE Use as directed 15 mLs in the mouth or throat as needed for mouth pain.   oxyCODONE 5 MG immediate release tablet Commonly known as: Oxy IR/ROXICODONE Take 1 tablet (5 mg total) by mouth every 6 (six) hours as needed for up to 5 days for severe pain (pain scale 4-7).   prenatal multivitamin Tabs tablet Take 1 tablet by mouth daily at 12 noon.   promethazine 12.5 MG tablet Commonly known as: PHENERGAN Take 1 tablet (12.5 mg total) by mouth every 6 (six) hours as needed for nausea or vomiting.       Diet: routine diet  Activity: Advance as tolerated. Pelvic rest for 6 weeks.   Outpatient follow VQ:[id number]:Postpartum visit in 6 weeks; incision check in two weeks if any issues Follow up Appt:No future appointments. Follow up Visit:No follow-ups on file.  Postpartum contraception: Tubal Ligation  Newborn Data: Live born female  Birth Weight: 10 lb (4536 g) APGAR: 9,  10  Newborn Delivery   Birth date/time: 09/04/2018 12:14:00 Delivery type: Vaginal, Spontaneous      Baby Feeding: Bottle Disposition:home with mother   09/06/2018 Cathrine Musterecilia W Banga, DO

## 2018-09-06 NOTE — Progress Notes (Signed)
Patient ID: Sylvia Green, female   DOB: 06/18/1995, 23 y.o.   MRN: [id number] PPD#2/POD#2 Pt doing well. She reports pain well controlled. Lochia mild. No HA//blurry vision/CP or SOB. She is ambulating and tolerating diet well. She has no complaints. Ready for discharge but baby under bilirubin lights VSS GEN - NAD ABD - soft, dressing c/d/i, FF EXT - no homans or edema  A/P: PPD#2/POD#2- s/p svd and ppBTL - stable          Discharge to home today - instructions reviewed

## 2018-09-06 NOTE — Discharge Instructions (Signed)
Call office with any concerns [phone] 

## 2018-12-15 ENCOUNTER — Other Ambulatory Visit: Payer: Self-pay

## 2018-12-15 ENCOUNTER — Ambulatory Visit (HOSPITAL_COMMUNITY)
Admission: EM | Admit: 2018-12-15 | Discharge: 2018-12-15 | Disposition: A | Discharge status: Home / Self Care | Payer: Medicaid Other | Attending: Family Medicine | Admitting: Family Medicine

## 2018-12-15 ENCOUNTER — Encounter (HOSPITAL_COMMUNITY): Payer: Self-pay | Admitting: Emergency Medicine

## 2018-12-15 DIAGNOSIS — S86911A Strain of unspecified muscle(s) and tendon(s) at lower leg level, right leg, initial encounter: Secondary | ICD-10-CM

## 2018-12-15 MED ORDER — NAPROXEN 500 MG PO TABS: 500.0000 mg | ORAL_TABLET | Freq: Two times a day (BID) | ORAL | 0 refills | Status: DC

## 2018-12-15 NOTE — ED Provider Notes (Signed)
[facility]    CSN: [id number] Arrival date & time: 12/15/18  1746      History   Chief Complaint Chief Complaint  Patient presents with  . Knee Pain    HPI Sylvia Green is a 23 y.o. female.   Sylvia Green presents with complaints of right knee pain. Two nights ago she was climbing a fence, when she landed on the ground her knee twisted, resulting in pain. Has had pain since. It has improved. Worse if her knee twists at all. She has been ambulatory. No numbness tingling or weakness. Pain 2/10. Hasn't taken any medications for symptoms. Denies any previous knee injury. Without contributing medical history.     ROS per HPI, negative if not otherwise mentioned.      Past Medical History:  Diagnosis Date  . Anemia   . Chlamydia infection 06/26/2014  . Depression   . Gonorrhea     Patient Active Problem List   Diagnosis Date Noted  . Indication for care in labor and delivery, antepartum 09/04/2018  . NSVD (normal spontaneous vaginal delivery) 09/04/2018  . Chlamydia infection 06/26/2014  . Eczema, dyshidrotic 05/24/2014  . Anemia 10/03/2013  . Acute depression 02/08/2013  . History of gonorrhea 10/19/2010    Past Surgical History:  Procedure Laterality Date  . NO PAST SURGERIES    . TUBAL LIGATION Bilateral 09/04/2018   Procedure: POST PARTUM TUBAL LIGATION;  Surgeon: Huel Cote, MD;  Location: [facility];  Service: Gynecology;  Laterality: Bilateral;    OB History    Gravida  3   Para  3   Term  3   Preterm      AB      Living  3     SAB      TAB      Ectopic      Multiple  0   Live Births  3            Home Medications    Prior to Admission medications   Medication Sig Start Date End Date Taking? Authorizing Provider  ibuprofen (ADVIL) 600 MG tablet Take 1 tablet (600 mg total) by mouth every 6 (six) hours as needed for moderate pain or cramping. 09/06/18   Banga, Cecilia Worema, DO  naproxen (NAPROSYN) 500 MG  tablet Take 1 tablet (500 mg total) by mouth 2 (two) times daily. 12/15/18   Georgetta Haber, NP  Prenatal Vit-Fe Fumarate-FA (PRENATAL MULTIVITAMIN) TABS tablet Take 1 tablet by mouth daily at 12 noon.    [provider]  promethazine (PHENERGAN) 12.5 MG tablet Take 1 tablet (12.5 mg total) by mouth every 6 (six) hours as needed for nausea or vomiting. Patient not taking: Reported on 03/07/2018 01/10/18 12/15/18  Janne Napoleon, NP    Family History Family History  Problem Relation Age of Onset  . Hypertension Mother   . Depression Mother   . Anxiety disorder Mother   . Hyperlipidemia Mother     Social History Social History   Tobacco Use  . Smoking status: Never Smoker  . Smokeless tobacco: Never Used  Substance Use Topics  . Alcohol use: No  . Drug use: No     Allergies   Patient has no known allergies.   Review of Systems Review of Systems   Physical Exam Triage Vital Signs ED Triage Vitals  Enc Vitals Group     BP 12/15/18 1800 113/74     Pulse Rate 12/15/18 1800 87  Resp 12/15/18 1800 16     Temp 12/15/18 1800 99 F (37.2 C)     Temp src --      SpO2 12/15/18 1800 100 %     Weight --      Height --      Head Circumference --      Peak Flow --      Pain Score 12/15/18 1802 0     Pain Loc --      Pain Edu? --      Excl. in GC? --    No data found.  Updated Vital Signs BP 113/74   Pulse 87   Temp 99 F (37.2 C)   Resp 16   LMP 11/29/2018   SpO2 100%   Breastfeeding No    Physical Exam Constitutional:      General: She is not in acute distress.    Appearance: She is well-developed.  Cardiovascular:     Rate and Rhythm: Normal rate.  Pulmonary:     Effort: Pulmonary effort is normal.  Musculoskeletal:     Right knee: She exhibits swelling. She exhibits normal range of motion, no ecchymosis, no deformity, no laceration, no erythema, normal alignment, normal patellar mobility, no bony tenderness, normal meniscus and no MCL laxity.      Comments: Right lateral knee with mild swelling; no point tenderness; mind pain with stress applied to lateral knee; no pain with flexion or extension; gross sensation intact; strong pedal pulse   Skin:    General: Skin is warm and dry.  Neurological:     Mental Status: She is alert and oriented to person, place, and time.      UC Treatments / Results  Labs (all labs ordered are listed, but only abnormal results are displayed) Labs Reviewed - No data to display  EKG   Radiology No results found.  Procedures Procedures (including critical care time)  Medications Ordered in UC Medications - No data to display  Initial Impression / Assessment and Plan / UC Course  I have reviewed the triage vital signs and the nursing notes.  Pertinent labs & imaging results that were available during my care of the patient were reviewed by me and considered in my medical decision making (see chart for details).     History and physical consistent with right knee sprain. Brace provided. Pain management discussed. Follow up with sports medicine as needed. Patient verbalized understanding and agreeable to plan.  Ambulatory out of clinic without difficulty.    Final Clinical Impressions(s) / UC Diagnoses   Final diagnoses:  Strain of right knee, initial encounter     Discharge Instructions     Ice, elevate, use of brace as needed, naproxen twice a day as needed, take with food.  Follow up with sports medicine as needed for persistent symptoms. May take a few weeks up to month even to improve.   To help your symptoms get better, you can:  Rest your muscle and avoid movements or activities that cause pain  Ice the area - You can put a cold gel pack, bag of ice, or bag of frozen vegetables on the painful muscle every 1 to 2 hours, for 15 minutes each time. Put a thin towel between the ice (or other cold object) and your skin. Use the ice (or other cold object) for at least 6 hours after  the injury. Some people find it helpful to ice up to 2 days after an injury.  Wrap  your muscle with an elastic bandage, other type of wrap, or fabric "sleeve" (picture 1) - This helps support your muscle.  Raise the muscle above the level of your heart (if possible) - For example, you can prop your leg up on pillows. This is helpful only for the first few days after an injury.  Take medicine to reduce the pain and swelling - If you have a lot of pain or a severe muscle strain, your doctor will prescribe a strong pain medicine. If your strain is not severe, you can take an over-the-counter medicine such as acetaminophen (sample brand name: Tylenol), ibuprofen (sample brand names: Advil, Motrin), or naproxen (sample brand name: Aleve).     ED Prescriptions    Medication Sig Dispense Auth. Provider   naproxen (NAPROSYN) 500 MG tablet Take 1 tablet (500 mg total) by mouth 2 (two) times daily. 30 tablet Zigmund Gottron, NP     PDMP not reviewed this encounter.   Zigmund Gottron, NP 12/15/18 1842

## 2018-12-15 NOTE — Discharge Instructions (Signed)
Ice, elevate, use of brace as needed, naproxen twice a day as needed, take with food.  Follow up with sports medicine as needed for persistent symptoms. May take a few weeks up to month even to improve.   To help your symptoms get better, you can:  Rest your muscle and avoid movements or activities that cause pain  Ice the area - You can put a cold gel pack, bag of ice, or bag of frozen vegetables on the painful muscle every 1 to 2 hours, for 15 minutes each time. Put a thin towel between the ice (or other cold object) and your skin. Use the ice (or other cold object) for at least 6 hours after the injury. Some people find it helpful to ice up to 2 days after an injury.  Wrap your muscle with an elastic bandage, other type of wrap, or fabric "sleeve" (picture 1) - This helps support your muscle.  Raise the muscle above the level of your heart (if possible) - For example, you can prop your leg up on pillows. This is helpful only for the first few days after an injury.  Take medicine to reduce the pain and swelling - If you have a lot of pain or a severe muscle strain, your doctor will prescribe a strong pain medicine. If your strain is not severe, you can take an over-the-counter medicine such as acetaminophen (sample brand name: Tylenol), ibuprofen (sample brand names: Advil, Motrin), or naproxen (sample brand name: Aleve).

## 2018-12-15 NOTE — ED Triage Notes (Signed)
Pt states Wednesday night she was climbing on a fence and states "I think I fell on it". C/o ongoing R knee pain. Pt c/o swelling, pain with certain movements. Ambulatory with steady gait.

## 2019-09-19 ENCOUNTER — Ambulatory Visit (INDEPENDENT_AMBULATORY_CARE_PROVIDER_SITE_OTHER): Payer: Medicaid Other | Admitting: Internal Medicine

## 2019-09-19 ENCOUNTER — Encounter: Payer: Self-pay | Admitting: Internal Medicine

## 2019-09-19 DIAGNOSIS — Z7689 Persons encountering health services in other specified circumstances: Secondary | ICD-10-CM

## 2019-09-19 NOTE — Progress Notes (Signed)
Virtual Visit via Telephone Note  I connected with Sylvia Green, on 09/19/2019 at 3:08 PM by telephone due to the COVID-19 pandemic and verified that I am speaking with the correct person using two identifiers.   Consent: I discussed the limitations, risks, security and privacy concerns of performing an evaluation and management service by telephone and the availability of in person appointments. I also discussed with the patient that there may be a patient responsible charge related to this service. The patient expressed understanding and agreed to proceed.   Location of Patient: Home   Location of Provider: Clinic    Persons participating in Telemedicine visit: Tanya Crothers [facility] Dr. Earlene Plater    History of Present Illness: Patient has a visit to establish care. Has not had a primary care doctor recently. Did she OB during her pregnancy last year. No significant PMH. Denies significant problems during pregnancies. Hx 3 vaginal deliveries. Past surgical history: BTL. No medications that she takes on a daily basis.    Past Medical History:  Diagnosis Date  . Anemia   . Chlamydia infection 06/26/2014  . Depression   . Gonorrhea    No Known Allergies  Current Outpatient Medications on File Prior to Visit  Medication Sig Dispense Refill  . [DISCONTINUED] promethazine (PHENERGAN) 12.5 MG tablet Take 1 tablet (12.5 mg total) by mouth every 6 (six) hours as needed for nausea or vomiting. (Patient not taking: Reported on 03/07/2018) 30 tablet 0   No current facility-administered medications on file prior to visit.    Observations/Objective: NAD. Speaking clearly.  Work of breathing normal.  Alert and oriented. Mood appropriate.   Assessment and Plan: 1. Encounter to establish care Reviewed patient's PMH, social history, surgical history, and medications.  Is overdue for annual exam, screening blood work, and health maintenance topics. Have asked patient to return for  visit to address these items.  Was able to abstract that patient had normal PAP in Feb 2020. Repeat in 3 years.    Follow Up Instructions: Annual exam    I discussed the assessment and treatment plan with the patient. The patient was provided an opportunity to ask questions and all were answered. The patient agreed with the plan and demonstrated an understanding of the instructions.   The patient was advised to call back or seek an in-person evaluation if the symptoms worsen or if the condition fails to improve as anticipated.     I provided 10 minutes total of non-face-to-face time during this encounter including median intraservice time, reviewing previous notes, investigations, ordering medications, medical decision making, coordinating care and patient verbalized understanding at the end of the visit.    Marcy Siren, D.O. Primary Care at [facility]  09/19/2019, 3:08 PM

## 2019-12-11 ENCOUNTER — Encounter: Payer: Medicaid Other | Admitting: Internal Medicine

## 2019-12-11 DIAGNOSIS — Z1159 Encounter for screening for other viral diseases: Secondary | ICD-10-CM

## 2019-12-11 DIAGNOSIS — Z113 Encounter for screening for infections with a predominantly sexual mode of transmission: Secondary | ICD-10-CM

## 2019-12-11 DIAGNOSIS — Z13228 Encounter for screening for other metabolic disorders: Secondary | ICD-10-CM

## 2019-12-11 DIAGNOSIS — Z0000 Encounter for general adult medical examination without abnormal findings: Secondary | ICD-10-CM

## 2019-12-11 DIAGNOSIS — Z13220 Encounter for screening for lipoid disorders: Secondary | ICD-10-CM

## 2019-12-11 DIAGNOSIS — Z130 Encounter for screening for diseases of the blood and blood-forming organs and certain disorders involving the immune mechanism: Secondary | ICD-10-CM

## 2020-07-14 ENCOUNTER — Emergency Department (HOSPITAL_COMMUNITY)
Admission: EM | Admit: 2020-07-14 | Discharge: 2020-07-14 | Discharge status: Absent without leave | Payer: Medicaid Other

## 2020-07-14 NOTE — ED Notes (Signed)
Pt called multiple times for triage, no answer. 

## 2022-03-03 ENCOUNTER — Other Ambulatory Visit: Payer: Self-pay

## 2022-03-03 ENCOUNTER — Encounter (HOSPITAL_COMMUNITY): Payer: Self-pay | Admitting: Emergency Medicine

## 2022-03-03 ENCOUNTER — Emergency Department (HOSPITAL_COMMUNITY)
Admission: EM | Admit: 2022-03-03 | Discharge: 2022-03-03 | Disposition: A | Discharge status: Home / Self Care | Payer: Medicaid Other | Attending: Emergency Medicine | Admitting: Emergency Medicine

## 2022-03-03 DIAGNOSIS — R1084 Generalized abdominal pain: Secondary | ICD-10-CM | POA: Diagnosis present

## 2022-03-03 DIAGNOSIS — R197 Diarrhea, unspecified: Secondary | ICD-10-CM | POA: Insufficient documentation

## 2022-03-03 DIAGNOSIS — R112 Nausea with vomiting, unspecified: Secondary | ICD-10-CM | POA: Insufficient documentation

## 2022-03-03 DIAGNOSIS — K529 Noninfective gastroenteritis and colitis, unspecified: Secondary | ICD-10-CM

## 2022-03-03 LAB — COMPREHENSIVE METABOLIC PANEL
ALT: 11 U/L (ref 0–44)
AST: 13 U/L — ABNORMAL LOW (ref 15–41)
Albumin: 4.3 g/dL (ref 3.5–5.0)
Alkaline Phosphatase: 49 U/L (ref 38–126)
Anion gap: 7 (ref 5–15)
BUN: 11 mg/dL (ref 6–20)
CO2: 24 mmol/L (ref 22–32)
Calcium: 8.8 mg/dL — ABNORMAL LOW (ref 8.9–10.3)
Chloride: 108 mmol/L (ref 98–111)
Creatinine, Ser: 0.69 mg/dL (ref 0.44–1.00)
GFR, Estimated: >60 mL/min (ref >60)
Glucose, Bld: 89 mg/dL (ref 70–99)
Potassium: 4 mmol/L (ref 3.5–5.1)
Sodium: 139 mmol/L (ref 135–145)
Total Bilirubin: 0.9 mg/dL (ref 0.3–1.2)
Total Protein: 7.3 g/dL (ref 6.5–8.1)

## 2022-03-03 LAB — CBC WITH DIFFERENTIAL/PLATELET
Abs Immature Granulocytes: 0.01 10*3/uL (ref 0.00–0.07)
Basophils Absolute: 0 10*3/uL (ref 0.0–0.1)
Basophils Relative: 0 %
Eosinophils Absolute: 0 10*3/uL (ref 0.0–0.5)
Eosinophils Relative: 1 %
HCT: 40.2 % (ref 36.0–46.0)
Hemoglobin: 13.5 g/dL (ref 12.0–15.0)
Immature Granulocytes: 0 %
Lymphocytes Relative: 21 %
Lymphs Abs: 1.3 10*3/uL (ref 0.7–4.0)
MCH: 31.5 pg (ref 26.0–34.0)
MCHC: 33.6 g/dL (ref 30.0–36.0)
MCV: 93.9 fL (ref 80.0–100.0)
Monocytes Absolute: 0.4 10*3/uL (ref 0.1–1.0)
Monocytes Relative: 7 %
Neutro Abs: 4.2 10*3/uL (ref 1.7–7.7)
Neutrophils Relative %: 71 %
Platelets: 190 10*3/uL (ref 150–400)
RBC: 4.28 MIL/uL (ref 3.87–5.11)
RDW: 13.2 % (ref 11.5–15.5)
WBC: 6 10*3/uL (ref 4.0–10.5)
nRBC: 0 % (ref 0.0–0.2)

## 2022-03-03 LAB — LIPASE, BLOOD: Lipase: 34 U/L (ref 11–51)

## 2022-03-03 MED ORDER — ONDANSETRON HCL 4 MG/2ML IJ SOLN
4.0000 mg | Freq: Once | INTRAMUSCULAR | Status: AC
  Administered 2022-03-03: 4 mg via INTRAVENOUS
  Filled 2022-03-03: qty 2

## 2022-03-03 MED ORDER — ONDANSETRON HCL 4 MG PO TABS: 4.0000 mg | ORAL_TABLET | Freq: Three times a day (TID) | ORAL | 0 refills | Status: DC | PRN

## 2022-03-03 MED ORDER — KETOROLAC TROMETHAMINE 30 MG/ML IJ SOLN
30.0000 mg | Freq: Once | INTRAMUSCULAR | Status: AC
  Administered 2022-03-03: 30 mg via INTRAVENOUS
  Filled 2022-03-03: qty 1

## 2022-03-03 MED ORDER — SODIUM CHLORIDE 0.9 % IV BOLUS
1000.0000 mL | Freq: Once | INTRAVENOUS | Status: AC
  Administered 2022-03-03: 1000 mL via INTRAVENOUS

## 2022-03-03 NOTE — ED Triage Notes (Signed)
Patient arrives ambulatory by POV c/o generalized abdominal pain and N/V/D onset of yesterday.

## 2022-03-03 NOTE — ED Notes (Signed)
Pt tolerated PO challenge

## 2022-03-03 NOTE — ED Provider Notes (Signed)
[place] EMERGENCY DEPARTMENT AT [facility] Provider Note   CSN: [id number] Arrival date & time: 03/03/22  1044     History  Chief Complaint  Patient presents with   Abdominal Pain   Diarrhea    Sylvia Green is a 27 y.o. female.  HPI   27 year old female presents emergency department with concern for 1 day of generalized abdominal pain, nausea/vomiting/diarrhea.  Patient denies any blood in the emesis/stools.  Denies any fever but endorses chills.  She had a significant decrease in appetite.  Has only been tolerating small sips of water p.o.  Patient is status post bilateral tubal ligation.  Denies any genitourinary symptoms, pelvic pain, vaginal discharge.  Home Medications Prior to Admission medications   Medication Sig Start Date End Date Taking? Authorizing Provider  promethazine (PHENERGAN) 12.5 MG tablet Take 1 tablet (12.5 mg total) by mouth every 6 (six) hours as needed for nausea or vomiting. Patient not taking: Reported on 03/07/2018 01/10/18 12/15/18  Ashley Murrain, NP      Allergies    Patient has no known allergies.    Review of Systems   Review of Systems  Constitutional:  Positive for appetite change and fatigue. Negative for fever.  Respiratory:  Negative for shortness of breath.   Cardiovascular:  Negative for chest pain.  Gastrointestinal:  Positive for abdominal pain, diarrhea, nausea and vomiting.  Genitourinary:  Negative for dysuria and pelvic pain.  Skin:  Negative for rash.  Neurological:  Negative for headaches.    Physical Exam Updated Vital Signs BP 107/76   Pulse 65   Temp 98.4 F (36.9 C) (Oral)   Resp 16   Ht 5' 7.5" (1.715 m)   Wt 62.6 kg   LMP 03/03/2022   SpO2 100%   BMI 21.29 kg/m  Physical Exam Vitals and nursing note reviewed.  Constitutional:      General: She is not in acute distress.    Appearance: Normal appearance.  HENT:     Head: Normocephalic.     Mouth/Throat:     Mouth: Mucous membranes are  moist.  Cardiovascular:     Rate and Rhythm: Normal rate.  Pulmonary:     Effort: Pulmonary effort is normal. No respiratory distress.  Abdominal:     General: Bowel sounds are normal. There is no distension.     Palpations: Abdomen is soft.     Tenderness: There is generalized abdominal tenderness. There is no guarding or rebound.     Hernia: No hernia is present.  Skin:    General: Skin is warm.  Neurological:     Mental Status: She is alert and oriented to person, place, and time. Mental status is at baseline.  Psychiatric:        Mood and Affect: Mood normal.     ED Results / Procedures / Treatments   Labs (all labs ordered are listed, but only abnormal results are displayed) Labs Reviewed  COMPREHENSIVE METABOLIC PANEL - Abnormal; Notable for the following components:      Result Value   Calcium 8.8 (*)    AST 13 (*)    All other components within normal limits  CBC WITH DIFFERENTIAL/PLATELET  LIPASE, BLOOD    EKG None  Radiology No results found.  Procedures Procedures    Medications Ordered in ED Medications  sodium chloride 0.9 % bolus 1,000 mL (1,000 mLs Intravenous New Bag/Given 03/03/22 1207)  ondansetron (ZOFRAN) injection 4 mg (4 mg Intravenous Given 03/03/22 1207)  ketorolac (TORADOL) 30 MG/ML injection 30 mg (30 mg Intravenous Given 03/03/22 1207)    ED Course/ Medical Decision Making/ A&P                             Medical Decision Making Amount and/or Complexity of Data Reviewed Labs: ordered.  Risk Prescription drug management.   27 year old female presents emergency department generalized abdominal pain, nausea/vomiting/diarrhea.  Vitals are normal and stable on arrival.  Abdomen is diffusely uncomfortable but benign.  Blood work is normal.  She is status post tubal ligation.  After IV fluids and medicine she has been able to p.o.  Will plan for outpatient follow-up.  Patient at this time appears safe and stable for discharge and close  outpatient follow up. Discharge plan and strict return to ED precautions discussed, patient verbalizes understanding and agreement.        Final Clinical Impression(s) / ED Diagnoses Final diagnoses:  None    Rx / DC Orders ED Discharge Orders     None         Lorelle Gibbs, DO 03/03/22 1427

## 2022-03-03 NOTE — ED Notes (Signed)
Pt states she is in 10/10 pain. Pt speaking on phone and having conversation. Dr. Dina Rich notified

## 2022-03-03 NOTE — ED Notes (Signed)
Pt given fluids for PO challenge.

## 2022-03-03 NOTE — ED Notes (Addendum)
Two LT greens sent to the lab for recollect.

## 2022-03-03 NOTE — Discharge Instructions (Addendum)
You have been seen and discharged from the emergency department.  Take nausea medicine as needed.  You were given IV fluids and medicine.  Follow-up with your primary provider for further evaluation and further care. Take home medications as prescribed. If you have any worsening symptoms or further concerns for your health please return to an emergency department for further evaluation.

## 2022-03-03 NOTE — ED Notes (Signed)
Pt to bathroom, pt states she is having diarrhea.

## 2022-03-03 NOTE — ED Notes (Signed)
Pt ambulatory to bathroom for urine sample.

## 2022-03-29 ENCOUNTER — Emergency Department (HOSPITAL_COMMUNITY): Payer: Medicaid Other

## 2022-03-29 ENCOUNTER — Other Ambulatory Visit: Payer: Self-pay

## 2022-03-29 ENCOUNTER — Encounter (HOSPITAL_COMMUNITY): Payer: Self-pay

## 2022-03-29 ENCOUNTER — Emergency Department (HOSPITAL_COMMUNITY)
Admission: EM | Admit: 2022-03-29 | Discharge: 2022-03-29 | Disposition: A | Discharge status: Home / Self Care | Payer: Medicaid Other | Attending: Emergency Medicine | Admitting: Emergency Medicine

## 2022-03-29 DIAGNOSIS — D72829 Elevated white blood cell count, unspecified: Secondary | ICD-10-CM | POA: Insufficient documentation

## 2022-03-29 DIAGNOSIS — R1012 Left upper quadrant pain: Secondary | ICD-10-CM | POA: Diagnosis present

## 2022-03-29 DIAGNOSIS — N3001 Acute cystitis with hematuria: Secondary | ICD-10-CM | POA: Diagnosis not present

## 2022-03-29 LAB — COMPREHENSIVE METABOLIC PANEL
ALT: 21 U/L (ref 0–44)
AST: 16 U/L (ref 15–41)
Albumin: 4.2 g/dL (ref 3.5–5.0)
Alkaline Phosphatase: 59 U/L (ref 38–126)
Anion gap: 8 (ref 5–15)
BUN: 12 mg/dL (ref 6–20)
CO2: 25 mmol/L (ref 22–32)
Calcium: 9.1 mg/dL (ref 8.9–10.3)
Chloride: 104 mmol/L (ref 98–111)
Creatinine, Ser: 0.58 mg/dL (ref 0.44–1.00)
GFR, Estimated: >60 mL/min (ref >60)
Glucose, Bld: 90 mg/dL (ref 70–99)
Potassium: 3.9 mmol/L (ref 3.5–5.1)
Sodium: 137 mmol/L (ref 135–145)
Total Bilirubin: 0.9 mg/dL (ref 0.3–1.2)
Total Protein: 7.6 g/dL (ref 6.5–8.1)

## 2022-03-29 LAB — URINALYSIS, ROUTINE W REFLEX MICROSCOPIC
Bilirubin Urine: NEGATIVE
Glucose, UA: NEGATIVE mg/dL
Ketones, ur: NEGATIVE mg/dL
Nitrite: NEGATIVE
Protein, ur: 100 mg/dL — AB
Specific Gravity, Urine: 1.013 (ref 1.005–1.030)
WBC, UA: >50 WBC/hpf (ref 0–5)
pH: 7 (ref 5.0–8.0)

## 2022-03-29 LAB — CBC WITH DIFFERENTIAL/PLATELET
Abs Immature Granulocytes: 0.01 10*3/uL (ref 0.00–0.07)
Basophils Absolute: 0 10*3/uL (ref 0.0–0.1)
Basophils Relative: 0 %
Eosinophils Absolute: 0 10*3/uL (ref 0.0–0.5)
Eosinophils Relative: 0 %
HCT: 38.6 % (ref 36.0–46.0)
Hemoglobin: 13 g/dL (ref 12.0–15.0)
Immature Granulocytes: 0 %
Lymphocytes Relative: 24 %
Lymphs Abs: 2 10*3/uL (ref 0.7–4.0)
MCH: 31.6 pg (ref 26.0–34.0)
MCHC: 33.7 g/dL (ref 30.0–36.0)
MCV: 93.9 fL (ref 80.0–100.0)
Monocytes Absolute: 0.4 10*3/uL (ref 0.1–1.0)
Monocytes Relative: 5 %
Neutro Abs: 5.9 10*3/uL (ref 1.7–7.7)
Neutrophils Relative %: 71 %
Platelets: 194 10*3/uL (ref 150–400)
RBC: 4.11 MIL/uL (ref 3.87–5.11)
RDW: 13.2 % (ref 11.5–15.5)
WBC: 8.3 10*3/uL (ref 4.0–10.5)
nRBC: 0 % (ref 0.0–0.2)

## 2022-03-29 LAB — LIPASE, BLOOD: Lipase: 32 U/L (ref 11–51)

## 2022-03-29 LAB — PREGNANCY, URINE: Preg Test, Ur: NEGATIVE

## 2022-03-29 MED ORDER — CEPHALEXIN 500 MG PO CAPS: 500.0000 mg | ORAL_CAPSULE | Freq: Two times a day (BID) | ORAL | 0 refills | Status: AC

## 2022-03-29 MED ORDER — KETOROLAC TROMETHAMINE 15 MG/ML IJ SOLN
15.0000 mg | Freq: Once | INTRAMUSCULAR | Status: AC
  Administered 2022-03-29: 15 mg via INTRAMUSCULAR
  Filled 2022-03-29: qty 1

## 2022-03-29 MED ORDER — SODIUM CHLORIDE 0.9 % IV BOLUS
1000.0000 mL | Freq: Once | INTRAVENOUS | Status: AC
  Administered 2022-03-29: 1000 mL via INTRAVENOUS

## 2022-03-29 MED ORDER — HYDROCODONE-ACETAMINOPHEN 5-325 MG PO TABS
1.0000 | ORAL_TABLET | Freq: Once | ORAL | Status: AC
  Administered 2022-03-29: 1 via ORAL
  Filled 2022-03-29: qty 1

## 2022-03-29 NOTE — Discharge Instructions (Addendum)
You are seen in the emergency department for abdominal pain and diagnosed with a urinary tract infection.  Thankfully all your labs and imaging were largely reassuring without any acute process to account for your pain besides the abnormal urine sample.  Given this, we will treat this with a round of antibiotics that he should take as prescribed for the next 5 days.  Please return to the emergency department if symptoms worsen.

## 2022-03-29 NOTE — ED Triage Notes (Signed)
Patient said she has left sided abdominal pain for 2 days. No vomiting or diarrhea. Last period 2 days ago. No burning urination.

## 2022-03-29 NOTE — ED Provider Notes (Signed)
[place] EMERGENCY DEPARTMENT AT [facility] Provider Note   CSN: [id number] Arrival date & time: 03/29/22  1218     History Chief Complaint  Patient presents with   Abdominal Pain    Sylvia Green is a 27 y.o. female. Patient with past medical history significant for anemia presents to the ED with complaints of abdominal pain. She reports that abdominal pain began yesterday and has not improved. Denies any associated nausea, vomiting, diarrhea, fevers, acid reflux, or urinary symptoms such as dysuria, hematuria, or increased urinary urgency. Does not believe she could be pregnant at this time. No history of diverticulitis, pancreatitis, or Crohn's.  Denies any known sick contacts at home with similar symptoms.   Abdominal Pain      Home Medications Prior to Admission medications   Medication Sig Start Date End Date Taking? Authorizing Provider  cephALEXin (KEFLEX) 500 MG capsule Take 1 capsule (500 mg total) by mouth 2 (two) times daily for 5 days. 03/29/22 04/03/22 Yes Luvenia Heller, PA-C  ondansetron (ZOFRAN) 4 MG tablet Take 1 tablet (4 mg total) by mouth every 8 (eight) hours as needed for nausea or vomiting. 03/03/22   Horton, Alvin Critchley, DO  promethazine (PHENERGAN) 12.5 MG tablet Take 1 tablet (12.5 mg total) by mouth every 6 (six) hours as needed for nausea or vomiting. Patient not taking: Reported on 03/07/2018 01/10/18 12/15/18  Ashley Murrain, NP      Allergies    Patient has no known allergies.    Review of Systems   Review of Systems  Gastrointestinal:  Positive for abdominal pain.  All other systems reviewed and are negative.   Physical Exam Updated Vital Signs BP 104/61 (BP Location: Right Arm)   Pulse 93   Temp 98.6 F (37 C) (Oral)   Resp 18   Ht 5' 7.5" (1.715 m)   Wt 64 kg   LMP 03/03/2022   SpO2 100%   BMI 21.76 kg/m  Physical Exam Vitals and nursing note reviewed.  Constitutional:      General: She is not in acute distress.     Appearance: She is well-developed. She is not ill-appearing.  HENT:     Head: Normocephalic and atraumatic.  Cardiovascular:     Rate and Rhythm: Normal rate and regular rhythm.  Abdominal:     General: Bowel sounds are absent.     Tenderness: There is abdominal tenderness in the left upper quadrant and left lower quadrant. There is no right CVA tenderness, left CVA tenderness, guarding or rebound.  Skin:    General: Skin is warm and dry.     Capillary Refill: Capillary refill takes less than 2 seconds.  Neurological:     General: No focal deficit present.     Mental Status: She is alert.     ED Results / Procedures / Treatments   Labs (all labs ordered are listed, but only abnormal results are displayed) Labs Reviewed  URINALYSIS, ROUTINE W REFLEX MICROSCOPIC - Abnormal; Notable for the following components:      Result Value   APPearance HAZY (*)    Hgb urine dipstick LARGE (*)    Protein, ur 100 (*)    Leukocytes,Ua LARGE (*)    Bacteria, UA RARE (*)    All other components within normal limits  COMPREHENSIVE METABOLIC PANEL  CBC WITH DIFFERENTIAL/PLATELET  LIPASE, BLOOD  PREGNANCY, URINE    EKG None  Radiology CT ABDOMEN PELVIS WO CONTRAST  Result Date: 03/29/2022 CLINICAL  DATA:  Abdominal and flank pain EXAM: CT ABDOMEN AND PELVIS WITHOUT CONTRAST TECHNIQUE: Multidetector CT imaging of the abdomen and pelvis was performed following the standard protocol without IV contrast. RADIATION DOSE REDUCTION: This exam was performed according to the departmental dose-optimization program which includes automated exposure control, adjustment of the mA and/or kV according to patient size and/or use of iterative reconstruction technique. COMPARISON:  CT abdomen and pelvis 06/21/2014 FINDINGS: Lower chest: No acute abnormality. Hepatobiliary: No focal liver abnormality is seen. No gallstones, gallbladder wall thickening, or biliary dilatation. Pancreas: Unremarkable. No pancreatic  ductal dilatation or surrounding inflammatory changes. Spleen: Normal in size without focal abnormality. Adrenals/Urinary Tract: Adrenal glands are unremarkable. Kidneys are normal, without renal calculi, focal lesion, or hydronephrosis. Bladder is unremarkable. Stomach/Bowel: Stomach is within normal limits. Appendix appears normal. No evidence of bowel wall thickening, distention, or inflammatory changes. Vascular/Lymphatic: No significant vascular findings are present. No enlarged abdominal or pelvic lymph nodes. Reproductive: Uterus and bilateral adnexa are unremarkable. Other: There is trace free fluid in the pelvis. Musculoskeletal: No acute or significant osseous findings. IMPRESSION: 1. Trace free fluid in the pelvis, likely physiologic. 2. No other acute process in the abdomen or pelvis. Electronically Signed   By: Ronney Asters M.D.   On: 03/29/2022 19:13    Procedures Procedures   Medications Ordered in ED Medications  ketorolac (TORADOL) 15 MG/ML injection 15 mg (15 mg Intramuscular Given 03/29/22 1746)  HYDROcodone-acetaminophen (NORCO/VICODIN) 5-325 MG per tablet 1 tablet (1 tablet Oral Given 03/29/22 1746)  sodium chloride 0.9 % bolus 1,000 mL (1,000 mLs Intravenous New Bag/Given 03/29/22 1746)    ED Course/ Medical Decision Making/ A&P                           Medical Decision Making Amount and/or Complexity of Data Reviewed Labs: ordered. Radiology: ordered.  Risk Prescription drug management.   This patient presents to the ED for concern of abdominal pain.  Differential diagnosis includes gastroenteritis, urinary tract infection, appendicitis, bowel obstruction   Lab Tests:  I Ordered, and personally interpreted labs.  The pertinent results include: Normal CBC, normal CMP, negative lipase, negative urine pregnancy, urinalysis consistent with infection with presence of blood, leukocytes, bacteria   Imaging Studies ordered:  I ordered imaging studies including CT  abdomen pelvis I independently visualized and interpreted imaging which showed no acute abnormality to account for symptoms I agree with the radiologist interpretation   Medicines ordered and prescription drug management:  I ordered medication including fluids, Toradol, Norco for pain  Reevaluation of the patient after these medicines showed that the patient improved I have reviewed the patients home medicines and have made adjustments as needed   Problem List / ED Course:  Patient presented to the ED for abdominal pain. Given vague presentation, thorough workup was performed which was largely reassuring with normal CBC, CMP, lipase, and negative pregnancy. UA was consistent with UTI but patient not experiencing typical UTI symptoms as majority of discomfort is pain in LLQ. CT abdomen was performed which thankfully did not show any other acute abnormalities to account for pain and this appears to be an isolated UTI producing this abdominal discomfort in patient. She stated she was not concerned for STIs at this time. Will treat this as acute cystitis with 5 day course of Keflex. Advised patient to follow up with PCP or return to ED if symptoms are not improving. Patient agreeable with treatment plan and verbalized  understanding all return precautions. All questions answered prior to patient discharge.   Social Determinants of Health:  No PCP  Final Clinical Impression(s) / ED Diagnoses Final diagnoses:  Acute cystitis with hematuria    Rx / DC Orders ED Discharge Orders          Ordered    cephALEXin (KEFLEX) 500 MG capsule  2 times daily        03/29/22 1926              Luvenia Heller, PA-C 03/29/22 1930    Jeanell Sparrow, DO 03/31/22 [phone]

## 2022-03-29 NOTE — ED Provider Triage Note (Signed)
Emergency Medicine Provider Triage Evaluation Note  Sylvia Green , a 27 y.o. female  was evaluated in triage.  Pt complains of abdominal pain.  She reports abdominal pain began yesterday without significant improvement.  Denies any nausea, vomiting, diarrhea.  Also denies any associated fevers, acid reflux, urinary symptoms.  Does not believe that she could be pregnant at this time is unsure.  No prior history of diverticulitis, pancreatitis, Crohn's.  Review of Systems  Positive: As above Negative: As above  Physical Exam  BP 105/68   Pulse 69   Temp 98.8 F (37.1 C) (Oral)   Resp 16   Ht 5' 7.5" (1.715 m)   Wt 64 kg   LMP 03/03/2022   SpO2 100%   BMI 21.76 kg/m  Gen:   Awake, no distress   Resp:  Normal effort  MSK:   Moves extremities without difficulty  Other:  Some tenderness to palpation along the epigastric and left abdomen regions  Medical Decision Making  Medically screening exam initiated at 12:29 PM.  Appropriate orders placed.  Sylvia Green was informed that the remainder of the evaluation will be completed by another provider, this initial triage assessment does not replace that evaluation, and the importance of remaining in the ED until their evaluation is complete.     Luvenia Heller, PA-C 03/29/22 1230

## 2022-03-31 ENCOUNTER — Other Ambulatory Visit: Payer: Self-pay

## 2022-03-31 ENCOUNTER — Encounter (HOSPITAL_BASED_OUTPATIENT_CLINIC_OR_DEPARTMENT_OTHER): Payer: Self-pay

## 2022-03-31 DIAGNOSIS — Z1152 Encounter for screening for COVID-19: Secondary | ICD-10-CM | POA: Diagnosis not present

## 2022-03-31 DIAGNOSIS — R519 Headache, unspecified: Secondary | ICD-10-CM | POA: Diagnosis present

## 2022-03-31 NOTE — ED Triage Notes (Signed)
Pt to ED by POV from home c/o a headache onset of 2 days ago. Pt states "my blood got real low", is unable to elaborate what exactly that means. Pt also endorses a headache 8/10. Arrives A+O, VSS, NADN.

## 2022-04-01 ENCOUNTER — Emergency Department (HOSPITAL_BASED_OUTPATIENT_CLINIC_OR_DEPARTMENT_OTHER)
Admission: EM | Admit: 2022-04-01 | Discharge: 2022-04-01 | Disposition: A | Discharge status: Home / Self Care | Payer: Medicaid Other | Attending: Emergency Medicine | Admitting: Emergency Medicine

## 2022-04-01 DIAGNOSIS — R519 Headache, unspecified: Secondary | ICD-10-CM

## 2022-04-01 DIAGNOSIS — N3000 Acute cystitis without hematuria: Secondary | ICD-10-CM

## 2022-04-01 LAB — RESP PANEL BY RT-PCR (RSV, FLU A&B, COVID)  RVPGX2
Influenza A by PCR: NEGATIVE
Influenza B by PCR: NEGATIVE
Resp Syncytial Virus by PCR: NEGATIVE
SARS Coronavirus 2 by RT PCR: NEGATIVE

## 2022-04-01 LAB — PREGNANCY, URINE: Preg Test, Ur: NEGATIVE

## 2022-04-01 MED ORDER — ACETAMINOPHEN 500 MG PO TABS
1000.0000 mg | ORAL_TABLET | Freq: Once | ORAL | Status: AC
  Administered 2022-04-01: 1000 mg via ORAL
  Filled 2022-04-01: qty 2

## 2022-04-01 MED ORDER — IBUPROFEN 800 MG PO TABS
800.0000 mg | ORAL_TABLET | Freq: Once | ORAL | Status: AC
  Administered 2022-04-01: 800 mg via ORAL
  Filled 2022-04-01: qty 1

## 2022-04-01 MED ORDER — CEPHALEXIN 250 MG PO CAPS
500.0000 mg | ORAL_CAPSULE | Freq: Once | ORAL | Status: AC
  Administered 2022-04-01: 500 mg via ORAL
  Filled 2022-04-01: qty 2

## 2022-04-01 NOTE — ED Notes (Signed)
Patient ambulateing to restroom. Urine sample requested

## 2022-04-01 NOTE — ED Notes (Signed)
Reviewed AVS/discharge instruction with patient. Time allotted for and all questions answered. Patient is agreeable for d/c and escorted to ed exit by staff.  

## 2022-04-01 NOTE — ED Provider Notes (Signed)
[place] Provider Note   CSN: [id number] Arrival date & time: 03/31/22  2125     History  Chief Complaint  Patient presents with   Headache    Sylvia Green is a 27 y.o. female.  The history is provided by the patient.  Illness Location:  Head Quality:  Pain Severity:  Moderate Onset quality:  Gradual Duration:  1 day Timing:  Constant Progression:  Unchanged Chronicity:  New Context:  Did not fill her antibiotics for UTi diagnosed yesterday Relieved by:  Nothing Worsened by:  Nothing Ineffective treatments:  None tried Associated symptoms: headaches   Associated symptoms: no fever, no nausea, no vomiting and no wheezing   Associated symptoms comment:  Feels cold      Home Medications Prior to Admission medications   Medication Sig Start Date End Date Taking? Authorizing Provider  cephALEXin (KEFLEX) 500 MG capsule Take 1 capsule (500 mg total) by mouth 2 (two) times daily for 5 days. 03/29/22 04/03/22  Luvenia Heller, PA-C  ondansetron (ZOFRAN) 4 MG tablet Take 1 tablet (4 mg total) by mouth every 8 (eight) hours as needed for nausea or vomiting. 03/03/22   Horton, Alvin Critchley, DO  promethazine (PHENERGAN) 12.5 MG tablet Take 1 tablet (12.5 mg total) by mouth every 6 (six) hours as needed for nausea or vomiting. Patient not taking: Reported on 03/07/2018 01/10/18 12/15/18  Ashley Murrain, NP      Allergies    Patient has no known allergies.    Review of Systems   Review of Systems  Constitutional:  Negative for fever.  HENT:  Negative for facial swelling.   Eyes:  Negative for photophobia and visual disturbance.  Respiratory:  Negative for wheezing and stridor.   Gastrointestinal:  Negative for nausea and vomiting.  Musculoskeletal:  Negative for neck pain and neck stiffness.  Neurological:  Positive for headaches.  All other systems reviewed and are negative.   Physical Exam Updated Vital Signs BP 117/83    Pulse (!) 104   Temp 98.5 F (36.9 C)   Resp 19   Ht 5' 8.4" (1.737 m)   Wt 64.9 kg   LMP 02/26/2022 (Approximate)   SpO2 100%   BMI 21.49 kg/m  Physical Exam Vitals and nursing note reviewed.  Constitutional:      General: She is not in acute distress.    Appearance: Normal appearance. She is well-developed.  HENT:     Head: Normocephalic and atraumatic.     Nose: Nose normal.  Eyes:     Pupils: Pupils are equal, round, and reactive to light.  Cardiovascular:     Rate and Rhythm: Normal rate and regular rhythm.     Pulses: Normal pulses.     Heart sounds: Normal heart sounds.  Pulmonary:     Effort: Pulmonary effort is normal. No respiratory distress.     Breath sounds: Normal breath sounds.  Abdominal:     General: Abdomen is flat. Bowel sounds are normal. There is no distension.     Palpations: Abdomen is soft.     Tenderness: There is no abdominal tenderness. There is no guarding or rebound.  Genitourinary:    Vagina: No vaginal discharge.  Musculoskeletal:        General: Normal range of motion.     Cervical back: Neck supple.  Skin:    General: Skin is warm and dry.     Capillary Refill: Capillary refill takes less than 2  seconds.     Findings: No erythema or rash.  Neurological:     General: No focal deficit present.     Mental Status: She is alert and oriented to person, place, and time.     Deep Tendon Reflexes: Reflexes normal.  Psychiatric:        Mood and Affect: Mood normal.        Behavior: Behavior normal.     ED Results / Procedures / Treatments   Labs (all labs ordered are listed, but only abnormal results are displayed) Labs Reviewed  RESP PANEL BY RT-PCR (RSV, FLU A&B, COVID)  RVPGX2  PREGNANCY, URINE    EKG None  Radiology No results found.  Procedures Procedures    Medications Ordered in ED Medications  cephALEXin (KEFLEX) capsule 500 mg (has no administration in time range)  acetaminophen (TYLENOL) tablet 1,000 mg (has no  administration in time range)  ibuprofen (ADVIL) tablet 800 mg (has no administration in time range)    ED Course/ Medical Decision Making/ A&P                             Medical Decision Making Patient with headache and feeling cold,  seen yesterday for UTi has not filled RX  Amount and/or Complexity of Data Reviewed External Data Reviewed: labs, radiology and notes.    Details: Previous ED visit and discharge reviewed  Labs: ordered.    Details: Negative covid and flu   Risk OTC drugs. Prescription drug management. Risk Details: Dose of keflex given in the ED.  Tylenol and ibuprofen given for pain.  Alternate tylenol and ibuprofen.  Pick up your RX and start taking antibiotic as directed.  Well appearing.  Stable for discharge.  Strict return     Final Clinical Impression(s) / ED Diagnoses Final diagnoses:  None   Return for intractable cough, coughing up blood, fevers > 100.4 unrelieved by medication, shortness of breath, intractable vomiting, chest pain, shortness of breath, weakness, numbness, changes in speech, facial asymmetry, abdominal pain, passing out, Inability to tolerate liquids or food, cough, altered mental status or any concerns. No signs of systemic illness or infection. The patient is nontoxic-appearing on exam and vital signs are within normal limits.  I have reviewed the triage vital signs and the nursing notes. Pertinent labs & imaging results that were available during my care of the patient were reviewed by me and considered in my medical decision making (see chart for details). After history, exam, and medical workup I feel the patient has been appropriately medically screened and is safe for discharge home. Pertinent diagnoses were discussed with the patient. Patient was given return precautions. Rx / DC Orders ED Discharge Orders     None         Zanyah Lentsch, MD 04/01/22 [id number]

## 2022-12-24 ENCOUNTER — Encounter (HOSPITAL_COMMUNITY): Payer: Self-pay

## 2022-12-24 ENCOUNTER — Emergency Department (HOSPITAL_COMMUNITY)
Admission: EM | Admit: 2022-12-24 | Discharge: 2022-12-24 | Disposition: A | Discharge status: Home / Self Care | Payer: Medicaid Other | Attending: Emergency Medicine | Admitting: Emergency Medicine

## 2022-12-24 ENCOUNTER — Other Ambulatory Visit: Payer: Self-pay

## 2022-12-24 DIAGNOSIS — Z1152 Encounter for screening for COVID-19: Secondary | ICD-10-CM | POA: Insufficient documentation

## 2022-12-24 DIAGNOSIS — R112 Nausea with vomiting, unspecified: Secondary | ICD-10-CM

## 2022-12-24 DIAGNOSIS — J029 Acute pharyngitis, unspecified: Secondary | ICD-10-CM

## 2022-12-24 LAB — RESP PANEL BY RT-PCR (RSV, FLU A&B, COVID)  RVPGX2
Influenza A by PCR: NEGATIVE
Influenza B by PCR: NEGATIVE
Resp Syncytial Virus by PCR: NEGATIVE
SARS Coronavirus 2 by RT PCR: NEGATIVE

## 2022-12-24 LAB — GROUP A STREP BY PCR: Group A Strep by PCR: NOT DETECTED

## 2022-12-24 LAB — PREGNANCY, URINE: Preg Test, Ur: NEGATIVE

## 2022-12-24 MED ORDER — ONDANSETRON HCL 4 MG PO TABS: 4.0000 mg | ORAL_TABLET | Freq: Three times a day (TID) | ORAL | 0 refills | Status: AC | PRN

## 2022-12-24 MED ORDER — ONDANSETRON 4 MG PO TBDP
4.0000 mg | ORAL_TABLET | Freq: Once | ORAL | Status: AC
  Administered 2022-12-24: 4 mg via ORAL
  Filled 2022-12-24: qty 1

## 2022-12-24 NOTE — ED Provider Notes (Signed)
[place] EMERGENCY DEPARTMENT AT [facility] Provider Note   CSN: [id number] Arrival date & time: 12/24/22  8657     History  Chief Complaint  Patient presents with   Emesis   Sore Throat    Sylvia Green is a 27 y.o. female otherwise healthy presents with complaints of 2-day onset of sore throat, abdominal pain, nausea and vomiting.  She has had difficulty keeping any food down.  Consuming fluids without difficulty.  Denies any fevers, chills, congestion or cough.  No chest pain or shortness of breath.  No recent sick contacts.   Emesis Sore Throat       Home Medications Prior to Admission medications   Medication Sig Start Date End Date Taking? Authorizing Provider  ondansetron (ZOFRAN) 4 MG tablet Take 1 tablet (4 mg total) by mouth every 8 (eight) hours as needed for nausea or vomiting. 12/24/22   Halford Decamp, PA-C  promethazine (PHENERGAN) 12.5 MG tablet Take 1 tablet (12.5 mg total) by mouth every 6 (six) hours as needed for nausea or vomiting. Patient not taking: Reported on 03/07/2018 01/10/18 12/15/18  Janne Napoleon, NP      Allergies    Patient has no known allergies.    Review of Systems   Review of Systems  Gastrointestinal:  Positive for vomiting.    Physical Exam Updated Vital Signs BP 101/71 (BP Location: Right Arm)   Pulse 87   Temp 97.8 F (36.6 C) (Oral)   Resp 18   Ht 5\' 7"  (1.702 m)   Wt 68 kg   SpO2 99%   BMI 23.49 kg/m  Physical Exam Vitals and nursing note reviewed.  Constitutional:      General: She is not in acute distress.    Appearance: She is well-developed.  HENT:     Head: Normocephalic and atraumatic.     Nose: No congestion.     Mouth/Throat:     Mouth: No oral lesions.     Pharynx: Uvula midline. No oropharyngeal exudate or uvula swelling.     Tonsils: No tonsillar exudate.  Eyes:     Conjunctiva/sclera: Conjunctivae normal.  Cardiovascular:     Rate and Rhythm: Normal rate and regular rhythm.      Heart sounds: No murmur heard. Pulmonary:     Effort: Pulmonary effort is normal. No respiratory distress.     Breath sounds: Normal breath sounds.  Abdominal:     General: Bowel sounds are normal.     Palpations: Abdomen is soft.     Comments: Mild epigastric tenderness.  No rebound or guarding, negative Murphy's  Musculoskeletal:        General: No swelling.     Cervical back: Neck supple.  Skin:    General: Skin is warm and dry.     Capillary Refill: Capillary refill takes less than 2 seconds.  Neurological:     Mental Status: She is alert.  Psychiatric:        Mood and Affect: Mood normal.     ED Results / Procedures / Treatments   Labs (all labs ordered are listed, but only abnormal results are displayed) Labs Reviewed  RESP PANEL BY RT-PCR (RSV, FLU A&B, COVID)  RVPGX2  GROUP A STREP BY PCR  PREGNANCY, URINE    EKG None  Radiology No results found.  Procedures Procedures    Medications Ordered in ED Medications  ondansetron (ZOFRAN-ODT) disintegrating tablet 4 mg (4 mg Oral Given 12/24/22 1043)  ED Course/ Medical Decision Making/ A&P                                 Medical Decision Making  This patient presents to the ED with chief complaint(s) of sore throat, abdominal pain, nausea and vomiting with no pertinent past medical history.  The complaint involves an extensive differential diagnosis and also carries with it a high risk of complications and morbidity.    The differential diagnosis includes  Viral URI, strep, PUD, cholecystitis, gastritis, appendicitis The initial plan is to  Will obtain respiratory panel, strep test and pregnancy test Additional history obtained: No additional historians or records utilized.  Initial Assessment:   Patient is hemodynamically stable, afebrile.  Overall well-appearing.  No exudate or lymphadenopathy appreciated.  Low suspicion for strep throat.  She has no trismus.  Normal phonation.  Uvula is midline.  No  suspicion for Ludwigs, or peritonsillar abscess.  Normal lung sounds, without cough or fever.  No suspicion for pneumonia.  She has very minimal epigastric tenderness, without rebound or guarding.  Low suspicion for surgical abdomen   Independent ECG interpretation:  None  Independent labs interpretation:  The following labs were independently interpreted:  Neg strep, resp panel and HCG  Independent visualization and interpretation of imaging: None Treatment and Reassessment: Following first assessment patient given Zofran  12:15 PM patient reports some improvement in her symptoms.  Discussed negative workup with the patient, she feels that she is ready to return home.  Consultations obtained:   none  Disposition:   Patient will be discharged home with a small quantity of Zofran, encouraged to use over-the-counter cold medications for presumed viral etiology. The patient has been appropriately medically screened and/or stabilized in the ED. I have low suspicion for any other emergent medical condition which would require further screening, evaluation or treatment in the ED or require inpatient management. At time of discharge the patient is hemodynamically stable and in no acute distress. I have discussed work-up results and diagnosis with patient and answered all questions. Patient is agreeable with discharge plan. We discussed strict return precautions for returning to the emergency department and they verbalized understanding.     Social Determinants of Health:   None  This note was dictated with voice recognition software.  Despite best efforts at proofreading, errors may have occurred which can change the documentation meaning.           Final Clinical Impression(s) / ED Diagnoses Final diagnoses:  Sore throat  Nausea and vomiting, unspecified vomiting type    Rx / DC Orders ED Discharge Orders          Ordered    ondansetron (ZOFRAN) 4 MG tablet  Every 8 hours PRN         12/24/22 1226              Fabienne Bruns 12/24/22 1229    Derwood Kaplan, MD 12/26/22 [id number]

## 2022-12-24 NOTE — ED Triage Notes (Signed)
Patient reports nausea, vomiting, and sore throat x 2 days. Denies abdominal pain, diarrhea, constipation, and fevers.

## 2022-12-24 NOTE — Discharge Instructions (Addendum)
It was a pleasure taking care of you this morning.  You were evaluated in the emergency room for sore throat and nausea and vomiting.  A viral and strep panel were performed.  These were both normal.  Pregnancy test was additionally performed, this was negative. You were given Zofran for nausea.  A short prescription of this was sent into your pharmacy.  This can cause constipation so use sparingly.  You experience any new or worsening symptoms including worsening abdominal pain, persistent vomiting and fevers please return to the emergency room.

## 2023-11-28 ENCOUNTER — Other Ambulatory Visit: Payer: Self-pay

## 2023-11-28 ENCOUNTER — Emergency Department (HOSPITAL_COMMUNITY)
Admission: EM | Admit: 2023-11-28 | Discharge: 2023-11-28 | Disposition: A | Discharge status: Home / Self Care | Payer: Self-pay

## 2023-11-28 ENCOUNTER — Encounter (HOSPITAL_COMMUNITY): Payer: Self-pay

## 2023-11-28 DIAGNOSIS — J069 Acute upper respiratory infection, unspecified: Secondary | ICD-10-CM | POA: Insufficient documentation

## 2023-11-28 LAB — RESP PANEL BY RT-PCR (RSV, FLU A&B, COVID)  RVPGX2
Influenza A by PCR: NEGATIVE
Influenza B by PCR: NEGATIVE
Resp Syncytial Virus by PCR: NEGATIVE
SARS Coronavirus 2 by RT PCR: NEGATIVE

## 2023-11-28 NOTE — Discharge Instructions (Signed)
 Influenza, covid and Rsv are negative.  Tylenol  for fever, Drink plenty of fluids.  Return if any problems.

## 2023-11-28 NOTE — ED Provider Notes (Signed)
 [place] [facility] AT [facility] Provider Note   CSN: [id number] Arrival date & time: 11/28/23  1253     Patient presents with: Nasal Congestion   Sylvia Green is a 28 y.o. female.   Patient complains of a cough and runny nose.  Patient reports symptoms began on Friday.  Patient reports having some eye redness and nasal congestion.  Patient denies being around anyone who is sick.  She has not had influenza or COVID exposure.  Patient denies any shortness of breath.  Patient denies fever or chills.  Patient reports she has decreased appetite but she has been drinking plenty of fluids.  The history is provided by the patient. No language interpreter was used.       Prior to Admission medications   Medication Sig Start Date End Date Taking? Authorizing Provider  ondansetron  (ZOFRAN ) 4 MG tablet Take 1 tablet (4 mg total) by mouth every 8 (eight) hours as needed for nausea or vomiting. 12/24/22   Donnajean Lynwood DEL, PA-C  promethazine  (PHENERGAN ) 12.5 MG tablet Take 1 tablet (12.5 mg total) by mouth every 6 (six) hours as needed for nausea or vomiting. Patient not taking: Reported on 03/07/2018 01/10/18 12/15/18  Jamelle Lorrayne HERO, NP    Allergies: Patient has no known allergies.    Review of Systems  All other systems reviewed and are negative.   Updated Vital Signs BP 121/67 (BP Location: Right Arm)   Pulse 97   Temp 97.9 F (36.6 C) (Oral)   Resp 18   SpO2 98%   Physical Exam Vitals and nursing note reviewed.  Constitutional:      Appearance: She is well-developed.  HENT:     Head: Normocephalic.     Right Ear: Tympanic membrane normal.     Left Ear: Tympanic membrane normal.     Nose: Nose normal.     Mouth/Throat:     Mouth: Mucous membranes are moist.  Cardiovascular:     Rate and Rhythm: Normal rate.  Pulmonary:     Effort: Pulmonary effort is normal.  Abdominal:     General: Abdomen is flat. There is no distension.  Musculoskeletal:         General: Normal range of motion.     Cervical back: Normal range of motion.  Skin:    General: Skin is warm.  Neurological:     General: No focal deficit present.     Mental Status: She is alert and oriented to person, place, and time.     (all labs ordered are listed, but only abnormal results are displayed) Labs Reviewed  RESP PANEL BY RT-PCR (RSV, FLU A&B, COVID)  [id number]    EKG: None  Radiology: No results found.   Procedures   Medications Ordered in the ED - No data to display                                  Medical Decision Making Patient complains of a cough and congestion patient has had a runny nose and some redness to her eyes  Amount and/or Complexity of Data Reviewed Labs: ordered. Decision-making details documented in ED Course.    Details: Influenza COVID and RSV are negative  Risk Risk Details: Patient counseled on management of viral illnesses she is encouraged to drink fluids she is advised Tylenol  for fever or bodyaches return if any problem  Final diagnoses:  URI, acute    ED Discharge Orders     None       An After Visit Summary was printed and given to the patient.    Flint Sonny POUR, PA-C 11/28/23 1452    Ula Prentice SAUNDERS, MD 11/29/23 [phone]

## 2023-11-28 NOTE — ED Triage Notes (Addendum)
 Pt reports with nasal congestion, sore throat, runny nose, headache and swollen eyes since Friday. (Eyes are not currently swollen).
